# Patient Record
Sex: Male | Born: 1980
Health system: Southern US, Community
[De-identification: ages and names within clinical notes are randomized; demographics above are authoritative.]

## PROBLEM LIST (undated history)

## (undated) DIAGNOSIS — F1010 Alcohol abuse, uncomplicated: Secondary | ICD-10-CM

## (undated) DIAGNOSIS — I10 Essential (primary) hypertension: Secondary | ICD-10-CM

## (undated) DIAGNOSIS — F32A Depression, unspecified: Secondary | ICD-10-CM

## (undated) DIAGNOSIS — E785 Hyperlipidemia, unspecified: Secondary | ICD-10-CM

## (undated) DIAGNOSIS — R55 Syncope and collapse: Secondary | ICD-10-CM

## (undated) HISTORY — DX: Hyperlipidemia, unspecified: E78.5

## (undated) HISTORY — DX: Depression, unspecified: F32.A

## (undated) HISTORY — DX: Alcohol abuse, uncomplicated: F10.10

## (undated) HISTORY — DX: Essential (primary) hypertension: I10

## (undated) HISTORY — DX: Syncope and collapse: R55

## (undated) HISTORY — PX: TYMPANOPLASTY: SHX33

---

## 2006-07-14 ENCOUNTER — Emergency Department: Payer: Self-pay | Admitting: Emergency Medicine

## 2008-02-06 ENCOUNTER — Emergency Department: Payer: Self-pay | Admitting: Emergency Medicine

## 2008-04-12 ENCOUNTER — Emergency Department: Payer: Self-pay | Admitting: Emergency Medicine

## 2008-04-14 ENCOUNTER — Emergency Department: Payer: Self-pay | Admitting: Internal Medicine

## 2009-08-10 ENCOUNTER — Emergency Department: Payer: Self-pay | Admitting: Emergency Medicine

## 2010-02-27 ENCOUNTER — Ambulatory Visit: Payer: Self-pay | Admitting: Family Medicine

## 2011-03-14 ENCOUNTER — Ambulatory Visit: Payer: Self-pay | Admitting: Family Medicine

## 2011-04-11 ENCOUNTER — Ambulatory Visit: Payer: Self-pay | Admitting: Family Medicine

## 2011-07-05 ENCOUNTER — Encounter: Payer: Self-pay | Admitting: Orthopedic Surgery

## 2011-07-08 ENCOUNTER — Encounter: Payer: Self-pay | Admitting: Orthopedic Surgery

## 2015-07-27 ENCOUNTER — Encounter: Payer: Self-pay | Admitting: Emergency Medicine

## 2015-07-27 ENCOUNTER — Emergency Department
Admission: EM | Admit: 2015-07-27 | Discharge: 2015-07-28 | Disposition: A | Discharge status: Home / Self Care | Payer: BLUE CROSS/BLUE SHIELD | Attending: Emergency Medicine | Admitting: Emergency Medicine

## 2015-07-27 DIAGNOSIS — M5431 Sciatica, right side: Secondary | ICD-10-CM

## 2015-07-27 DIAGNOSIS — M5441 Lumbago with sciatica, right side: Secondary | ICD-10-CM | POA: Diagnosis not present

## 2015-07-27 DIAGNOSIS — M545 Low back pain: Secondary | ICD-10-CM | POA: Diagnosis present

## 2015-07-27 DIAGNOSIS — R531 Weakness: Secondary | ICD-10-CM | POA: Diagnosis not present

## 2015-07-27 DIAGNOSIS — R29898 Other symptoms and signs involving the musculoskeletal system: Secondary | ICD-10-CM

## 2015-07-27 DIAGNOSIS — R52 Pain, unspecified: Secondary | ICD-10-CM

## 2015-07-27 MED ORDER — SODIUM CHLORIDE 0.9 % IV BOLUS (SEPSIS)
1000.0000 mL | Freq: Once | INTRAVENOUS | Status: AC
  Administered 2015-07-27: 1000 mL via INTRAVENOUS

## 2015-07-27 MED ORDER — MORPHINE SULFATE (PF) 4 MG/ML IV SOLN
4.0000 mg | Freq: Once | INTRAVENOUS | Status: AC
  Administered 2015-07-27: 4 mg via INTRAVENOUS

## 2015-07-27 MED ORDER — ONDANSETRON HCL 4 MG/2ML IJ SOLN
4.0000 mg | Freq: Once | INTRAMUSCULAR | Status: AC
  Administered 2015-07-27: 4 mg via INTRAVENOUS

## 2015-07-27 MED ORDER — MORPHINE SULFATE (PF) 2 MG/ML IV SOLN
INTRAVENOUS | Status: AC
  Filled 2015-07-27: qty 1

## 2015-07-27 MED ORDER — ONDANSETRON HCL 4 MG/2ML IJ SOLN
INTRAMUSCULAR | Status: AC
  Administered 2015-07-27: 4 mg via INTRAVENOUS
  Filled 2015-07-27: qty 2

## 2015-07-27 NOTE — ED Notes (Addendum)
Pt c/o pain to right leg for several days with right lower back pain; denies hx of same; st was lifting something several weeks with onset; pt with sudden onset restlessness, diaphoresis and dizziness while in triage; placed in recliner and taken immed to room 15; placed on card monitor; SL initated and Dr Manson PasseyBrown called to room; pt reports hx back injury weight lifting in his 20's; last 2 days has missed work due to back pain and has done nothing by lie around due to increased pain with movement and weight bearing; st unsure if his reaction is due to his pain or not because he has not been ambulating at home

## 2015-07-27 NOTE — ED Notes (Signed)
Per triage nurse, pt had near syncopal episode and was hypotensive and diaphoretic during triage, pt brought to rm 15, EKG done, pain medication given

## 2015-07-27 NOTE — ED Provider Notes (Signed)
[facility] Emergency Department Provider Note  ____________________________________________  Time seen:  11:20 PM  I have reviewed the triage vital signs and the nursing notes.   HISTORY  Chief Complaint Back Pain     HPI Andrew Parsons is a 35 y.o. male with history of previous back injury presents with right lower back pain with radiation down posterior buttocks and leg 2 days. Patient denies any leg weakness no numbness or gait instability. Patient states pain is worse with any movement improved with laying completely supine on his back. Current pain score 10 out of 10. Of note patient was being evaluated and in triage patient stated that he went from a seated to standing position with acute onset of dizziness and diaphoresis.    Past medical history Back injury many years ago There are no active problems to display for this patient.  Past surgical history None No current outpatient prescriptions on file.  Allergies No known drug allergies  No family history on file.  Social History Social History  Substance Use Topics  . Smoking status: Never Smoker   . Smokeless tobacco: None  . Alcohol Use: No    Review of Systems  Constitutional: Negative for fever. Eyes: Negative for visual changes. ENT: Negative for sore throat. Cardiovascular: Negative for chest pain. Respiratory: Negative for shortness of breath. Gastrointestinal: Negative for abdominal pain, vomiting and diarrhea. Genitourinary: Negative for dysuria. Musculoskeletal: Positive for back pain. Skin: Negative for rash. Neurological: Negative for headaches, focal weakness or numbness.   10-point ROS otherwise negative.  ____________________________________________   PHYSICAL EXAM:  VITAL SIGNS: ED Triage Vitals  Enc Vitals Group     BP 07/27/15 2303 132/64 mmHg     Pulse Rate 07/27/15 2303 75     Resp 07/27/15 2303 20     Temp 07/27/15 2303 97.8 F (36.6 C)     Temp  Source 07/27/15 2303 Oral     SpO2 07/27/15 2303 100 %     Weight 07/27/15 2303 195 lb (88.451 kg)     Height 07/27/15 2303 5\' 10"  (1.778 m)     Head Cir --      Peak Flow --      Pain Score 07/27/15 2310 10     Pain Loc --      Pain Edu? --      Excl. in GC? --      Constitutional: Alert and oriented. Well appearing and in no distress. Eyes: Conjunctivae are normal. PERRL. Normal extraocular movements. ENT   Head: Normocephalic and atraumatic.   Nose: No congestion/rhinnorhea.   Mouth/Throat: Mucous membranes are moist.   Neck: No stridor. Hematological/Lymphatic/Immunilogical: No cervical lymphadenopathy. Cardiovascular: Normal rate, regular rhythm. Normal and symmetric distal pulses are present in all extremities. No murmurs, rubs, or gallops. Respiratory: Normal respiratory effort without tachypnea nor retractions. Breath sounds are clear and equal bilaterally. No wheezes/rales/rhonchi. Gastrointestinal: Soft and nontender. No distention. There is no CVA tenderness. Genitourinary: deferred Musculoskeletal: Nontender with normal range of motion in all extremities. No joint effusions.  No lower extremity tenderness nor edema. Neurologic:  Normal speech and language. No gross focal neurologic deficits are appreciated. Speech is normal.  Skin:  Skin is warm, dry and intact. No rash noted. Psychiatric: Mood and affect are normal. Speech and behavior are normal. Patient exhibits appropriate insight and judgment.  ____________________________________________    LABS (pertinent positives/negatives)  Labs Reviewed  BASIC METABOLIC PANEL - Abnormal; Notable for the following:    Potassium  3.3 (*)    Creatinine, Ser 1.30 (*)    All other components within normal limits  CBC  TROPONIN I     ____________________________________________   EKG  ED ECG REPORT I, [place] Perkey, the attending physician, personally viewed and interpreted this ECG.   Date:  07/28/2015  EKG Time: 11:22 PM  Rate: 75  Rhythm: Normal sinus rhythm  Axis: Normal  Intervals: Normal  ST&T Change: None   ____________________________________________    RADIOLOGY  MR Lumbar Spine Wo Contrast (Final result) Result time: 07/28/15 01:40:09   Final result by Rad Results In Interface (07/28/15 01:40:09)   Narrative:   CLINICAL DATA: RIGHT leg pain for several days after lifting injury a few weeks prior. Evaluate RIGHT leg weakness.  EXAM: MRI LUMBAR SPINE WITHOUT CONTRAST  TECHNIQUE: Multiplanar, multisequence MR imaging of the lumbar spine was performed. No intravenous contrast was administered.  COMPARISON: None.  FINDINGS: OSSEOUS STRUCTURES: Lumbar vertebral bodies are intact and aligned with maintenance of lumbar lordosis. Using the reference level of the last well-formed intervertebral disc as L5-S1, moderate L5-S1 disc height loss with decreased T2 signal within this disc compatible with desiccation. Mild subacute discogenic endplate change at L5-S1. No STIR signal abnormality to suggest acute osseous process. Borderline congenital canal narrowing on the basis of foreshortened pedicles.  SPINAL CORD: Conus medullaris terminates at L1-2 and demonstrates normal morphology and signal characteristics. Cauda equina is normal.  SOFT TISSUES: Included prevertebral and paraspinal soft tissues are normal.  LEVEL BY LEVEL EVALUATION:  T12-L1 through L2-3: No disc bulge, canal stenosis nor neural foraminal narrowing.  L3-4: Annular bulging. Mild facet arthropathy without canal stenosis horn neural foraminal narrowing.  L4-5: Small broad-based disc bulge. Mild facet arthropathy and ligamentum flavum redundancy without canal stenosis. Minimal neural foraminal narrowing.  L5-S1: 8 mm AP RIGHT central disc protrusion and annular fissure contacts and deforms the traversing S1 nerves in the lateral recesses. Mild canal stenosis. Mild facet  arthropathy and ligamentum flavum redundancy. Minimal neural foraminal narrowing.  IMPRESSION: Moderate to large L5-S1 central disc protrusion and annular fissure contacts the traversing S1 nerves and results in mild canal stenosis.  Minimal L4-5 and L5-S1 neural foraminal narrowing.  No acute fracture or malalignment.   Electronically Signed By: Awilda Metro M.D. On: 07/28/2015 01:40     INITIAL IMPRESSION / ASSESSMENT AND PLAN / ED COURSE  Pertinent labs & imaging results that were available during my care of the patient were reviewed by me and considered in my medical decision making (see chart for details).  History of physical exam consistent with sciatica for presenting complaint. Regarding episode in the waiting room suspect orthostatic hypotension as patient's symptoms completely resolved at the time of evaluation and ED treatment room. No further events similar to what occurred in the waiting room.  ____________________________________________   FINAL CLINICAL IMPRESSION(S) / ED DIAGNOSES  Final diagnoses:  Pain  Right leg weakness  Sciatica of right side      Darci Current, MD 07/28/15 0320

## 2015-07-28 ENCOUNTER — Emergency Department: Payer: BLUE CROSS/BLUE SHIELD

## 2015-07-28 LAB — CBC
HEMATOCRIT: 46.7 % (ref 40.0–52.0)
HEMOGLOBIN: 16.5 g/dL (ref 13.0–18.0)
MCH: 31.8 pg (ref 26.0–34.0)
MCHC: 35.4 g/dL (ref 32.0–36.0)
MCV: 89.9 fL (ref 80.0–100.0)
Platelets: 244 10*3/uL (ref 150–440)
RBC: 5.19 MIL/uL (ref 4.40–5.90)
RDW: 13.6 % (ref 11.5–14.5)
WBC: 10 10*3/uL (ref 3.8–10.6)

## 2015-07-28 LAB — BASIC METABOLIC PANEL
ANION GAP: 7 (ref 5–15)
BUN: 16 mg/dL (ref 6–20)
CHLORIDE: 104 mmol/L (ref 101–111)
CO2: 28 mmol/L (ref 22–32)
Calcium: 9.1 mg/dL (ref 8.9–10.3)
Creatinine, Ser: 1.3 mg/dL — ABNORMAL HIGH (ref 0.61–1.24)
GFR calc Af Amer: >60 mL/min (ref >60)
GLUCOSE: 82 mg/dL (ref 65–99)
POTASSIUM: 3.3 mmol/L — AB (ref 3.5–5.1)
SODIUM: 139 mmol/L (ref 135–145)

## 2015-07-28 LAB — TROPONIN I: Troponin I: <0.03 ng/mL (ref <0.031)

## 2015-07-28 MED ORDER — OXYCODONE-ACETAMINOPHEN 5-325 MG PO TABS
1.0000 | ORAL_TABLET | Freq: Once | ORAL | Status: AC
  Administered 2015-07-28: 1 via ORAL
  Filled 2015-07-28: qty 1

## 2015-07-28 MED ORDER — OXYCODONE-ACETAMINOPHEN 5-325 MG PO TABS: 1.0000 | ORAL_TABLET | ORAL | Status: DC | PRN

## 2015-07-28 NOTE — ED Notes (Signed)
Pt returned from MRI °

## 2015-07-28 NOTE — Discharge Instructions (Signed)

## 2015-07-28 NOTE — ED Notes (Signed)
Pt taken to MRI  

## 2015-09-20 ENCOUNTER — Emergency Department
Admission: EM | Admit: 2015-09-20 | Discharge: 2015-09-20 | Disposition: A | Discharge status: Home / Self Care | Payer: BLUE CROSS/BLUE SHIELD | Attending: Student in an Organized Health Care Education/Training Program | Admitting: Student in an Organized Health Care Education/Training Program

## 2015-09-20 ENCOUNTER — Encounter: Payer: Self-pay | Admitting: Emergency Medicine

## 2015-09-20 DIAGNOSIS — Y929 Unspecified place or not applicable: Secondary | ICD-10-CM | POA: Diagnosis not present

## 2015-09-20 DIAGNOSIS — X500XXA Overexertion from strenuous movement or load, initial encounter: Secondary | ICD-10-CM | POA: Insufficient documentation

## 2015-09-20 DIAGNOSIS — S3992XA Unspecified injury of lower back, initial encounter: Secondary | ICD-10-CM | POA: Diagnosis present

## 2015-09-20 DIAGNOSIS — Y999 Unspecified external cause status: Secondary | ICD-10-CM | POA: Diagnosis not present

## 2015-09-20 DIAGNOSIS — Y9389 Activity, other specified: Secondary | ICD-10-CM | POA: Diagnosis not present

## 2015-09-20 DIAGNOSIS — S39012A Strain of muscle, fascia and tendon of lower back, initial encounter: Secondary | ICD-10-CM | POA: Diagnosis not present

## 2015-09-20 MED ORDER — NAPROXEN 500 MG PO TABS: 500.0000 mg | ORAL_TABLET | Freq: Two times a day (BID) | ORAL | 0 refills | Status: DC

## 2015-09-20 MED ORDER — CYCLOBENZAPRINE HCL 10 MG PO TABS: 10.0000 mg | ORAL_TABLET | Freq: Three times a day (TID) | ORAL | 0 refills | Status: DC | PRN

## 2015-09-20 NOTE — ED Provider Notes (Signed)
[facility] Emergency Department Provider Note ____________________________________________  Time seen: Approximately 7:37 PM  I have reviewed the triage vital signs and the nursing notes.   HISTORY  Chief Complaint Back Pain    HPI Andrew Parsons is a 35 y.o. male who presents to the emergency department for evaluation of lower back pain. He states that he was lifting some heavy weights several hours ago and feels like he pulled something.He has not taken anything for pain prior to arrival.  History reviewed. No pertinent past medical history.  There are no active problems to display for this patient.   History reviewed. No pertinent surgical history.  Prior to Admission medications   Medication Sig Start Date End Date Taking? Authorizing Provider  cyclobenzaprine (FLEXERIL) 10 MG tablet Take 1 tablet (10 mg total) by mouth 3 (three) times daily as needed for muscle spasms. 09/20/15   Chinita Pesterari B Saide Lanuza, FNP  naproxen (NAPROSYN) 500 MG tablet Take 1 tablet (500 mg total) by mouth 2 (two) times daily with a meal. 09/20/15   Chinita Pesterari B Dmarion Perfect, FNP    Allergies Review of patient's allergies indicates no known allergies.  No family history on file.  Social History Social History  Substance Use Topics  . Smoking status: Never Smoker  . Smokeless tobacco: Never Used  . Alcohol use No    Review of Systems Constitutional: No recent illness. Cardiovascular: Denies chest pain or palpitations. Respiratory: Denies shortness of breath. Musculoskeletal: Pain in Lower back Skin: Negative for rash, wound, lesion. Neurological: Negative for focal weakness or numbness.  ____________________________________________   PHYSICAL EXAM:  VITAL SIGNS: ED Triage Vitals  Enc Vitals Group     BP 09/20/15 1851 (!) 147/90     Pulse Rate 09/20/15 1851 96     Resp 09/20/15 1851 16     Temp 09/20/15 1851 98.9 F (37.2 C)     Temp Source 09/20/15 1851 Oral     SpO2  09/20/15 1851 98 %     Weight 09/20/15 1847 210 lb (95.3 kg)     Height 09/20/15 1847 5\' 11"  (1.803 m)     Head Circumference --      Peak Flow --      Pain Score 09/20/15 1847 8     Pain Loc --      Pain Edu? --      Excl. in GC? --     Constitutional: Alert and oriented. Well appearing and in no acute distress. Eyes: Conjunctivae are normal. EOMI. Head: Atraumatic. Neck: No stridor.  Respiratory: Normal respiratory effort.   Musculoskeletal: Limited flexion of the lower back due to pain. Patient noted to be ambulatory without assistance. No midline focal tenderness over the lumbar spine. No step-off or deformity noted. Neurologic:  Normal speech and language. No gross focal neurologic deficits are appreciated. Speech is normal. No gait instability. Skin:  Skin is warm, dry and intact. Atraumatic. Psychiatric: Mood and affect are normal. Speech and behavior are normal.  ____________________________________________   LABS (all labs ordered are listed, but only abnormal results are displayed)  Labs Reviewed - No data to display ____________________________________________  RADIOLOGY  Not indicated ____________________________________________   PROCEDURES  Procedure(s) performed: None   ____________________________________________   INITIAL IMPRESSION / ASSESSMENT AND PLAN / ED COURSE  Pertinent labs & imaging results that were available during my care of the patient were reviewed by me and considered in my medical decision making (see chart for details).  Patient was given prescriptions  for Flexeril and Naprosyn. He was instructed to follow-up with orthopedics for symptoms that are not improving over the week. He was instructed to return to the emergency department for symptoms that change or worsen if he is unable to schedule an appointment.  ____________________________________________   FINAL CLINICAL IMPRESSION(S) / ED DIAGNOSES  Final diagnoses:  Lumbar  strain, initial encounter       Chinita PesterCari B Sadik Piascik, FNP 09/20/15 2347    Willy EddyPatrick Robinson, MD 09/20/15 2351

## 2015-09-20 NOTE — ED Notes (Signed)
Patient presents to the ED with lower back since he was lifting heavy weights this am and felt, "like I pulled something".  Patient ambulatory to room 43 and is in no obvious distress at this time.  Patient states history of back injury before and having to come to ER.

## 2015-09-20 NOTE — ED Triage Notes (Signed)
Patient presents to the ED with lower back since he was lifting heavy weights several hours ago and felt, "like I pulled something".  Patient ambulatory to triage and is in no obvious distress at this time.  Patient reports history of back injury.

## 2015-09-20 NOTE — ED Notes (Signed)
Discharge instructions reviewed with patient. Questions fielded by this RN. Patient verbalizes understanding of instructions. Patient discharged home in stable condition per Triplett NP. No acute distress noted at time of discharge.   

## 2015-12-16 ENCOUNTER — Encounter: Payer: Self-pay | Admitting: Emergency Medicine

## 2015-12-16 ENCOUNTER — Emergency Department
Admission: EM | Admit: 2015-12-16 | Discharge: 2015-12-16 | Disposition: A | Discharge status: Home / Self Care | Payer: BLUE CROSS/BLUE SHIELD | Attending: Student in an Organized Health Care Education/Training Program | Admitting: Student in an Organized Health Care Education/Training Program

## 2015-12-16 DIAGNOSIS — H9201 Otalgia, right ear: Secondary | ICD-10-CM | POA: Diagnosis present

## 2015-12-16 DIAGNOSIS — H6121 Impacted cerumen, right ear: Secondary | ICD-10-CM | POA: Insufficient documentation

## 2015-12-16 DIAGNOSIS — H7291 Unspecified perforation of tympanic membrane, right ear: Secondary | ICD-10-CM | POA: Diagnosis not present

## 2015-12-16 DIAGNOSIS — Z791 Long term (current) use of non-steroidal anti-inflammatories (NSAID): Secondary | ICD-10-CM | POA: Diagnosis not present

## 2015-12-16 DIAGNOSIS — F17210 Nicotine dependence, cigarettes, uncomplicated: Secondary | ICD-10-CM | POA: Diagnosis not present

## 2015-12-16 MED ORDER — CIPROFLOXACIN-HYDROCORTISONE 0.2-1 % OT SUSP: 3.0000 [drp] | Freq: Two times a day (BID) | OTIC | 0 refills | Status: AC

## 2015-12-16 MED ORDER — AMOXICILLIN 875 MG PO TABS: 875.0000 mg | ORAL_TABLET | Freq: Two times a day (BID) | ORAL | 0 refills | Status: AC

## 2015-12-16 MED ORDER — CARBAMIDE PEROXIDE 6.5 % OT SOLN
3.0000 [drp] | OTIC | Status: AC
  Administered 2015-12-16: 3 [drp] via OTIC
  Filled 2015-12-16: qty 15

## 2015-12-16 NOTE — ED Notes (Signed)
Pt c/o right ear pain that began last night. Pt c/o sore throat X 3 days.

## 2015-12-16 NOTE — ED Triage Notes (Signed)
Patient ambulatory to triage with steady gait, without difficulty or distress noted; pt reports right earache, congestion since yesterday

## 2015-12-16 NOTE — ED Notes (Signed)
Ear wax irrigation completed in right ear.  Noted that there are abrasions in the right ear once it had been irrigated.

## 2015-12-16 NOTE — ED Provider Notes (Signed)
[facility] Emergency Department Provider Note  ____________________________________________  Time seen: Approximately 8:45 PM  I have reviewed the triage vital signs and the nursing notes.   HISTORY  Chief Complaint Otalgia   HPI Andrew Parsons is a 35 y.o. male presents with right ear pain for 2 days. Patient states that the pain is throbbing and constant. Patient states that he could feel his ears popping. Patient denies drainage. Patient has recently been sick with a sore throat. Patient denies additional URI symptoms including coughing, sneezing, sinus tenderness, fever, or chills. Patient has not taken anything for pain. Patient frequently uses earplugs at work. Patient denies using Q-tips. Patient has had frequent ear infections in the past.   History reviewed. No pertinent past medical history.  There are no active problems to display for this patient.   Past Surgical History:  Procedure Laterality Date  . TYMPANOPLASTY      Prior to Admission medications   Medication Sig Start Date End Date Taking? Authorizing Provider  amoxicillin (AMOXIL) 875 MG tablet Take 1 tablet (875 mg total) by mouth 2 (two) times daily. 12/16/15 12/26/15  Enid DerryAshley Bartt Gonzaga, PA-C  ciprofloxacin-hydrocortisone (CIPRO HC) otic suspension Place 3 drops into the right ear 2 (two) times daily. 12/16/15 12/23/15  Enid DerryAshley Savaya Hakes, PA-C  cyclobenzaprine (FLEXERIL) 10 MG tablet Take 1 tablet (10 mg total) by mouth 3 (three) times daily as needed for muscle spasms. 09/20/15   Chinita Pesterari B Triplett, FNP  naproxen (NAPROSYN) 500 MG tablet Take 1 tablet (500 mg total) by mouth 2 (two) times daily with a meal. 09/20/15   Chinita Pesterari B Triplett, FNP    Allergies Patient has no known allergies.  No family history on file.  Social History Social History  Substance Use Topics  . Smoking status: Current Every Day Smoker    Packs/day: 0.50    Types: Cigarettes  . Smokeless tobacco: Never Used  . Alcohol use  No    Review of Systems Constitutional: No fever/chills ENT: No sinus tenderness.  Cardiovascular: Denies chest pain. Respiratory: Negative shortness of breath. Negative for cough. Gastrointestinal: No nausea,  No vomiting.   Skin: Negative for rash.  ____________________________________________   PHYSICAL EXAM:  VITAL SIGNS: ED Triage Vitals  Enc Vitals Group     BP 12/16/15 2018 (!) 152/79     Pulse Rate 12/16/15 2018 73     Resp 12/16/15 2018 18     Temp 12/16/15 2018 98 F (36.7 C)     Temp Source 12/16/15 2018 Oral     SpO2 12/16/15 2018 100 %     Weight 12/16/15 2016 210 lb (95.3 kg)     Height 12/16/15 2016 5\' 11"  (1.803 m)     Head Circumference --      Peak Flow --      Pain Score 12/16/15 2017 9     Pain Loc --      Pain Edu? --      Excl. in GC? --     Constitutional: Alert and oriented. Patient well appearing and in no acute distress. Eyes: Conjunctivae are normal. EOMI. Ears: Tympanic membrane in right ear blocked by cerumen on initial examination. Ear canal red and irritated. After cerumen removal, tympanic membrane not visualized, no cone of light. Blood in canal. Left tympanic membrane pearly grey with cone of light and good landmarks.  Nose: No congestion; No rhinnorhea. Mouth/Throat: Mucous membranes are moist.  Oropharynx non erythematous. Tonsils appear non enlarged. Neck: No stridor.  Cardiovascular: Normal rate, regular rhythm. Grossly normal heart sounds.  Good peripheral circulation. Respiratory: Normal respiratory effort.  No retractions.  Gastrointestinal: Soft and nontender.  Musculoskeletal: FROM x 4 extremities.  Neurologic:  Normal speech and language.  Skin:  Skin is warm, dry and intact. No rash noted. Psychiatric: Mood and affect are normal. Speech and behavior are normal.  ____________________________________________   LABS (all labs ordered are listed, but only abnormal results are displayed)  Labs Reviewed - No data to  display ____________________________________________  ____________________________________________   PROCEDURES  Procedure(s) performed: Cerumen disimpaction.   Critical Care performed: No  ____________________________________________   INITIAL IMPRESSION / ASSESSMENT AND PLAN / ED COURSE  Clinical Course     Pertinent labs & imaging results that were available during my care of the patient were reviewed by me and considered in my medical decision making (see chart for details).  My assessment is that this patient has a perforated eardrum. This is likely because patient has had recent ear infections in the past, frequently uses earplugs at work, and had wax blocking ear canal. Ear canal was irritated and cone of light was not visualized.  ____________________________________________   FINAL CLINICAL IMPRESSION(S) / ED DIAGNOSES  Final diagnoses:  Perforated eardrum, right    Note:  This document was prepared using Dragon voice recognition software and may include unintentional dictation errors.    Enid DerryAshley Mardene Lessig, PA-C 12/16/15 2324    Willy EddyPatrick Robinson, MD 12/16/15 [phone]

## 2017-01-23 ENCOUNTER — Emergency Department
Admission: EM | Admit: 2017-01-23 | Discharge: 2017-01-23 | Disposition: A | Discharge status: Home / Self Care | Payer: BLUE CROSS/BLUE SHIELD | Attending: Emergency Medicine | Admitting: Emergency Medicine

## 2017-01-23 ENCOUNTER — Encounter: Payer: Self-pay | Admitting: Emergency Medicine

## 2017-01-23 DIAGNOSIS — L0291 Cutaneous abscess, unspecified: Secondary | ICD-10-CM

## 2017-01-23 DIAGNOSIS — L02411 Cutaneous abscess of right axilla: Secondary | ICD-10-CM | POA: Diagnosis present

## 2017-01-23 MED ORDER — SULFAMETHOXAZOLE-TRIMETHOPRIM 800-160 MG PO TABS: 1.0000 | ORAL_TABLET | Freq: Two times a day (BID) | ORAL | 0 refills | Status: AC

## 2017-01-23 NOTE — ED Triage Notes (Signed)
Pt to ED via POV with c/o "knot in armpit" x couple wks. Pt states it must have popped last night in his sleep and is now sore. Drainage noted to area but decrease swelling since last night. Pt in NAD at this time

## 2017-01-23 NOTE — ED Provider Notes (Signed)
[facility] Emergency Department Provider Note  ____________________________________________  Time seen: Approximately 4:25 PM  I have reviewed the triage vital signs and the nursing notes.   HISTORY  Chief Complaint Abscess    HPI Andrew Parsons is a 36 y.o. male presents to the emergency department with a remnant of a right axillary abscess.  Patient reports that it spontaneously started draining in his sleep.  Patient has never had an abscess in the past and conveys that he was "scared".  He denies fever or chills.  No alleviating measures have been attempted.   History reviewed. No pertinent past medical history.  There are no active problems to display for this patient.   Past Surgical History:  Procedure Laterality Date  . TYMPANOPLASTY      Prior to Admission medications   Medication Sig Start Date End Date Taking? Authorizing Provider  cyclobenzaprine (FLEXERIL) 10 MG tablet Take 1 tablet (10 mg total) by mouth 3 (three) times daily as needed for muscle spasms. 09/20/15   Triplett, Rulon Eisenmengerari B, FNP  naproxen (NAPROSYN) 500 MG tablet Take 1 tablet (500 mg total) by mouth 2 (two) times daily with a meal. 09/20/15   Triplett, Cari B, FNP  sulfamethoxazole-trimethoprim (BACTRIM DS,SEPTRA DS) 800-160 MG tablet Take 1 tablet by mouth 2 (two) times daily for 7 days. 01/23/17 01/30/17  Orvil FeilWoods, Cailyn Houdek M, PA-C    Allergies Patient has no known allergies.  No family history on file.  Social History Social History   Tobacco Use  . Smoking status: Current Every Day Smoker    Packs/day: 0.50    Types: Cigarettes  . Smokeless tobacco: Never Used  Substance Use Topics  . Alcohol use: No  . Drug use: No     Review of Systems  Constitutional: No fever/chills Eyes: No visual changes. No discharge ENT: No upper respiratory complaints. Cardiovascular: no chest pain. Respiratory: no cough. No SOB. Musculoskeletal: Negative for musculoskeletal pain. Skin:  Patient has right axillary abscess.  Neurological: Negative for headaches, focal weakness or numbness.  ____________________________________________   PHYSICAL EXAM:  VITAL SIGNS: ED Triage Vitals  Enc Vitals Group     BP 01/23/17 1508 137/77     Pulse Rate 01/23/17 1508 67     Resp 01/23/17 1508 18     Temp 01/23/17 1508 (!) 97.4 F (36.3 C)     Temp Source 01/23/17 1508 Oral     SpO2 01/23/17 1508 97 %     Weight 01/23/17 1508 215 lb (97.5 kg)     Height 01/23/17 1508 5\' 11"  (1.803 m)     Head Circumference --      Peak Flow --      Pain Score 01/23/17 1522 6     Pain Loc --      Pain Edu? --      Excl. in GC? --      Constitutional: Alert and oriented. Well appearing and in no acute distress. Eyes: Conjunctivae are normal. PERRL. EOMI. Head: Atraumatic. Cardiovascular: Normal rate, regular rhythm. Normal S1 and S2.  Good peripheral circulation. Respiratory: Normal respiratory effort without tachypnea or retractions. Lungs CTAB. Good air entry to the bases with no decreased or absent breath sounds. Musculoskeletal: Full range of motion to all extremities. No gross deformities appreciated. Neurologic:  Normal speech and language. No gross focal neurologic deficits are appreciated.  Skin: Patient has 2 cm x 2 cm spontaneously draining right axillary abscess that has been apparent for the past 3 days.  No induration or surrounding cellulitis. Psychiatric: Mood and affect are normal. Speech and behavior are normal. Patient exhibits appropriate insight and judgement.   ____________________________________________   LABS (all labs ordered are listed, but only abnormal results are displayed)  Labs Reviewed - No data to display ____________________________________________  EKG   ____________________________________________  RADIOLOGY   No results found.  ____________________________________________    PROCEDURES  Procedure(s) performed:     Procedures    Medications - No data to display   ____________________________________________   INITIAL IMPRESSION / ASSESSMENT AND PLAN / ED COURSE  Pertinent labs & imaging results that were available during my care of the patient were reviewed by me and considered in my medical decision making (see chart for details).  Review of the [place] CSRS was performed in accordance of the [facility] prior to dispensing any controlled drugs.     Assessment and plan Right axillary abscess Patient presents to the emergency department with a spontaneously draining 2 cm x 2 cm right axillary abscess.  Incision and drainage is not warranted at this time.  Patient was discharged with Bactrim. Vital signs are reassuring prior to discharge. All patient questions were answered.     ____________________________________________  FINAL CLINICAL IMPRESSION(S) / ED DIAGNOSES  Final diagnoses:  Abscess      NEW MEDICATIONS STARTED DURING THIS VISIT:  ED Discharge Orders        Ordered    sulfamethoxazole-trimethoprim (BACTRIM DS,SEPTRA DS) 800-160 MG tablet  2 times daily     01/23/17 1623          This chart was dictated using voice recognition software/Dragon. Despite best efforts to proofread, errors can occur which can change the meaning. Any change was purely unintentional.    Orvil FeilWoods, Becki Mccaskill M, PA-C 01/23/17 1631    Minna AntisPaduchowski, Kevin, MD 01/23/17 2329

## 2019-08-26 ENCOUNTER — Ambulatory Visit (INDEPENDENT_AMBULATORY_CARE_PROVIDER_SITE_OTHER): Payer: BC Managed Care – PPO | Admitting: Nurse Practitioner

## 2019-08-26 ENCOUNTER — Other Ambulatory Visit: Payer: Self-pay

## 2019-08-26 ENCOUNTER — Encounter: Payer: Self-pay | Admitting: Nurse Practitioner

## 2019-08-26 DIAGNOSIS — R5383 Other fatigue: Secondary | ICD-10-CM | POA: Insufficient documentation

## 2019-08-26 DIAGNOSIS — F419 Anxiety disorder, unspecified: Secondary | ICD-10-CM | POA: Insufficient documentation

## 2019-08-26 DIAGNOSIS — G479 Sleep disorder, unspecified: Secondary | ICD-10-CM | POA: Diagnosis not present

## 2019-08-26 DIAGNOSIS — F1920 Other psychoactive substance dependence, uncomplicated: Secondary | ICD-10-CM

## 2019-08-26 DIAGNOSIS — R55 Syncope and collapse: Secondary | ICD-10-CM | POA: Diagnosis not present

## 2019-08-26 HISTORY — DX: Syncope and collapse: R55

## 2019-08-26 HISTORY — DX: Other psychoactive substance dependence, uncomplicated: F19.20

## 2019-08-26 HISTORY — DX: Sleep disorder, unspecified: G47.9

## 2019-08-26 HISTORY — DX: Anxiety disorder, unspecified: F41.9

## 2019-08-26 HISTORY — DX: Other fatigue: R53.83

## 2019-08-26 LAB — COMPREHENSIVE METABOLIC PANEL
ALT: 36 U/L (ref 0–53)
AST: 23 U/L (ref 0–37)
Albumin: 4.4 g/dL (ref 3.5–5.2)
Alkaline Phosphatase: 68 U/L (ref 39–117)
BUN: 15 mg/dL (ref 6–23)
CO2: 29 mEq/L (ref 19–32)
Calcium: 9.3 mg/dL (ref 8.4–10.5)
Chloride: 103 mEq/L (ref 96–112)
Creatinine, Ser: 1.12 mg/dL (ref 0.40–1.50)
GFR: 87.42 mL/min (ref >60.00)
Glucose, Bld: 106 mg/dL — ABNORMAL HIGH (ref 70–99)
Potassium: 3.8 mEq/L (ref 3.5–5.1)
Sodium: 138 mEq/L (ref 135–145)
Total Bilirubin: 0.4 mg/dL (ref 0.2–1.2)
Total Protein: 7.3 g/dL (ref 6.0–8.3)

## 2019-08-26 LAB — CBC WITH DIFFERENTIAL/PLATELET
Basophils Absolute: 0 10*3/uL (ref 0.0–0.1)
Basophils Relative: 0.3 % (ref 0.0–3.0)
Eosinophils Absolute: 0.2 10*3/uL (ref 0.0–0.7)
Eosinophils Relative: 2.5 % (ref 0.0–5.0)
HCT: 41.4 % (ref 39.0–52.0)
Hemoglobin: 14.5 g/dL (ref 13.0–17.0)
Lymphocytes Relative: 27.8 % (ref 12.0–46.0)
Lymphs Abs: 1.9 10*3/uL (ref 0.7–4.0)
MCHC: 35.1 g/dL (ref 30.0–36.0)
MCV: 89 fl (ref 78.0–100.0)
Monocytes Absolute: 0.6 10*3/uL (ref 0.1–1.0)
Monocytes Relative: 8.6 % (ref 3.0–12.0)
Neutro Abs: 4.1 10*3/uL (ref 1.4–7.7)
Neutrophils Relative %: 60.8 % (ref 43.0–77.0)
Platelets: 205 10*3/uL (ref 150.0–400.0)
RBC: 4.65 Mil/uL (ref 4.22–5.81)
RDW: 13.4 % (ref 11.5–15.5)
WBC: 6.8 10*3/uL (ref 4.0–10.5)

## 2019-08-26 LAB — LIPID PANEL
Cholesterol: 191 mg/dL (ref 0–200)
HDL: 48.4 mg/dL (ref >39.00)
LDL Cholesterol: 114 mg/dL — ABNORMAL HIGH (ref 0–99)
NonHDL: 143.04
Total CHOL/HDL Ratio: 4
Triglycerides: 146 mg/dL (ref 0.0–149.0)
VLDL: 29.2 mg/dL (ref 0.0–40.0)

## 2019-08-26 LAB — VITAMIN D 25 HYDROXY (VIT D DEFICIENCY, FRACTURES): VITD: 22.39 ng/mL — ABNORMAL LOW (ref 30.00–100.00)

## 2019-08-26 LAB — HEMOGLOBIN A1C: Hgb A1c MFr Bld: 5.8 % (ref 4.6–6.5)

## 2019-08-26 LAB — TSH: TSH: 2.31 u[IU]/mL (ref 0.35–4.50)

## 2019-08-26 NOTE — Patient Instructions (Addendum)
Please go to the lab today.  I placed referral sent to neurology to evaluate your passing out spells, falling asleep while standing up, concern over poor memory, and sleep apnea studies.  I placed referral to cardiology regarding your passing out spells, racing heart, and sweating spells.  I recommend that you talk to [phone] for  substance abuse assistance.   You can also contact [facility]  Please make an office visit with me in 1 week to go over results make further recommendations.  Syncope  Syncope refers to a condition in which a person temporarily loses consciousness. Syncope may also be called fainting or passing out. It is caused by a sudden decrease in blood flow to the brain. Even though most causes of syncope are not dangerous, syncope can be a sign of a serious medical problem. Your health care provider may do tests to find the reason why you are having syncope. Signs that you may be about to faint include:  Feeling dizzy or light-headed.  Feeling nauseous.  Seeing all white or all black in your field of vision.  Having cold, clammy skin. If you faint, get medical help right away. Call your local emergency services (911 in the [place] Do not drive yourself to the hospital. Follow these instructions at home: Pay attention to any changes in your symptoms. Take these actions to stay safe and to help relieve your symptoms: Lifestyle  Do not drive, use machinery, or play sports until your health care provider says it is okay.  Do not drink alcohol.  Do not use any products that contain nicotine or tobacco, such as cigarettes and e-cigarettes. If you need help quitting, ask your health care provider.  Drink enough fluid to keep your urine pale yellow. General instructions  Take over-the-counter and prescription medicines only as told by your health care provider.  If you are taking blood pressure or heart medicine, get up slowly and take several  minutes to sit and then stand. This can reduce dizziness or light-headedness.  Have someone stay with you until you feel stable.  If you start to feel like you might faint, lie down right away and raise (elevate) your feet above the level of your heart. Breathe deeply and steadily. Wait until all the symptoms have passed.  Keep all follow-up visits as told by your health care provider. This is important. Get help right away if you:  Have a severe headache.  Faint once or repeatedly.  Have pain in your chest, abdomen, or back.  Have a very fast or irregular heartbeat (palpitations).  Have pain when you breathe.  Are bleeding from your mouth or rectum, or you have black or tarry stool.  Have a seizure.  Are confused.  Have trouble walking.  Have severe weakness.  Have vision problems. These symptoms may represent a serious problem that is an emergency. Do not wait to see if your symptoms will go away. Get medical help right away. Call your local emergency services (911 in the [place] Do not drive yourself to the hospital. Summary  Syncope refers to a condition in which a person temporarily loses consciousness. It is caused by a sudden decrease in blood flow to the brain.  Signs that you may be about to faint include dizziness, feeling light-headed, feeling nauseous, sudden vision changes, or cold, clammy skin.  Although most causes of syncope are not dangerous, syncope can be a sign of a serious medical problem. If you faint,  get medical help right away. This information is not intended to replace advice given to you by your health care provider. Make sure you discuss any questions you have with your health care provider. Document Revised: 01/05/2017 Document Reviewed: 01/01/2017 Elsevier Patient Education  [place].  Fatigue If you have fatigue, you feel tired all the time and have a lack of energy or a lack of motivation. Fatigue may make it difficult to start or  complete tasks because of exhaustion. In general, occasional or mild fatigue is often a normal response to activity or life. However, long-lasting (chronic) or extreme fatigue may be a symptom of a medical condition. Follow these instructions at home: General instructions  Watch your fatigue for any changes.  Go to bed and get up at the same time every day.  Avoid fatigue by pacing yourself during the day and getting enough sleep at night.  Maintain a healthy weight. Medicines  Take over-the-counter and prescription medicines only as told by your health care provider.  Take a multivitamin, if told by your health care provider.  Do not use herbal or dietary supplements unless they are approved by your health care provider. Activity   Exercise regularly, as told by your health care provider.  Use or practice techniques to help you relax, such as yoga, tai chi, meditation, or massage therapy. Eating and drinking   Avoid heavy meals in the evening.  Eat a well-balanced diet, which includes lean proteins, whole grains, plenty of fruits and vegetables, and low-fat dairy products.  Avoid consuming too much caffeine.  Avoid the use of alcohol.  Drink enough fluid to keep your urine pale yellow. Lifestyle  Change situations that cause you stress. Try to keep your work and personal schedule in balance.  Do not use any products that contain nicotine or tobacco, such as cigarettes and e-cigarettes. If you need help quitting, ask your health care provider.  Do not use drugs. Contact a health care provider if:  Your fatigue does not get better.  You have a fever.  You suddenly lose or gain weight.  You have headaches.  You have trouble falling asleep or sleeping through the night.  You feel angry, guilty, anxious, or sad.  You are unable to have a bowel movement (constipation).  Your skin is dry.  You have swelling in your legs or another part of your body. Get help  right away if:  You feel confused.  Your vision is blurry.  You feel faint or you pass out.  You have a severe headache.  You have severe pain in your abdomen, your back, or the area between your waist and hips (pelvis).  You have chest pain, shortness of breath, or an irregular or fast heartbeat.  You are unable to urinate, or you urinate less than normal.  You have abnormal bleeding, such as bleeding from the rectum, vagina, nose, lungs, or nipples.  You vomit blood.  You have thoughts about hurting yourself or others. If you ever feel like you may hurt yourself or others, or have thoughts about taking your own life, get help right away. You can go to your nearest emergency department or call:  Your local emergency services (911 in the [place]  A suicide crisis helpline, such as the [facility] at (740)138-6196. This is open 24 hours a day. Summary  If you have fatigue, you feel tired all the time and have a lack of energy or a lack of motivation.  Fatigue may make it difficult to start or complete tasks because of exhaustion.  Long-lasting (chronic) or extreme fatigue may be a symptom of a medical condition.  Exercise regularly, as told by your health care provider.  Change situations that cause you stress. Try to keep your work and personal schedule in balance. This information is not intended to replace advice given to you by your health care provider. Make sure you discuss any questions you have with your health care provider. Document Revised: 08/14/2018 Document Reviewed: 10/18/2016 Elsevier Patient Education  [place], Adult After being diagnosed with an anxiety disorder, you may be relieved to know why you have felt or behaved a certain way. You may also feel overwhelmed about the treatment ahead and what it will mean for your life. With care and support, you can manage this condition and recover from it. How  to manage lifestyle changes Managing stress and anxiety  Stress is your body's reaction to life changes and events, both good and bad. Most stress will last just a few hours, but stress can be ongoing and can lead to more than just stress. Although stress can play a major role in anxiety, it is not the same as anxiety. Stress is usually caused by something external, such as a deadline, test, or competition. Stress normally passes after the triggering event has ended.  Anxiety is caused by something internal, such as imagining a terrible outcome or worrying that something will go wrong that will devastate you. Anxiety often does not go away even after the triggering event is over, and it can become long-term (chronic) worry. It is important to understand the differences between stress and anxiety and to manage your stress effectively so that it does not lead to an anxious response. Talk with your health care provider or a counselor to learn more about reducing anxiety and stress. He or she may suggest tension reduction techniques, such as:  Music therapy. This can include creating or listening to music that you enjoy and that inspires you.  Mindfulness-based meditation. This involves being aware of your normal breaths while not trying to control your breathing. It can be done while sitting or walking.  Centering prayer. This involves focusing on a word, phrase, or sacred image that means something to you and brings you peace.  Deep breathing. To do this, expand your stomach and inhale slowly through your nose. Hold your breath for 3-5 seconds. Then exhale slowly, letting your stomach muscles relax.  Self-talk. This involves identifying thought patterns that lead to anxiety reactions and changing those patterns.  Muscle relaxation. This involves tensing muscles and then relaxing them. Choose a tension reduction technique that suits your lifestyle and personality. These techniques take time and  practice. Set aside 5-15 minutes a day to do them. Therapists can offer counseling and training in these techniques. The training to help with anxiety may be covered by some insurance plans. Other things you can do to manage stress and anxiety include:  Keeping a stress/anxiety diary. This can help you learn what triggers your reaction and then learn ways to manage your response.  Thinking about how you react to certain situations. You may not be able to control everything, but you can control your response.  Making time for activities that help you relax and not feeling guilty about spending your time in this way.  Visual imagery and yoga can help you stay calm and relax.  Medicines Medicines can help ease  symptoms. Medicines for anxiety include:  Anti-anxiety drugs.  Antidepressants. Medicines are often used as a primary treatment for anxiety disorder. Medicines will be prescribed by a health care provider. When used together, medicines, psychotherapy, and tension reduction techniques may be the most effective treatment. Relationships Relationships can play a big part in helping you recover. Try to spend more time connecting with trusted friends and family members. Consider going to couples counseling, taking family education classes, or going to family therapy. Therapy can help you and others better understand your condition. How to recognize changes in your anxiety Everyone responds differently to treatment for anxiety. Recovery from anxiety happens when symptoms decrease and stop interfering with your daily activities at home or work. This may mean that you will start to:  Have better concentration and focus. Worry will interfere less in your daily thinking.  Sleep better.  Be less irritable.  Have more energy.  Have improved memory. It is important to recognize when your condition is getting worse. Contact your health care provider if your symptoms interfere with home or work and  you feel like your condition is not improving. Follow these instructions at home: Activity  Exercise. Most adults should do the following: ? Exercise for at least 150 minutes each week. The exercise should increase your heart rate and make you sweat (moderate-intensity exercise). ? Strengthening exercises at least twice a week.  Get the right amount and quality of sleep. Most adults need 7-9 hours of sleep each night. Lifestyle   Eat a healthy diet that includes plenty of vegetables, fruits, whole grains, low-fat dairy products, and lean protein. Do not eat a lot of foods that are high in solid fats, added sugars, or salt.  Make choices that simplify your life.  Do not use any products that contain nicotine or tobacco, such as cigarettes, e-cigarettes, and chewing tobacco. If you need help quitting, ask your health care provider.  Avoid caffeine, alcohol, and certain over-the-counter cold medicines. These may make you feel worse. Ask your pharmacist which medicines to avoid. General instructions  Take over-the-counter and prescription medicines only as told by your health care provider.  Keep all follow-up visits as told by your health care provider. This is important. Where to find support You can get help and support from these sources:  Self-help groups.  Online and [employer].  A trusted spiritual leader.  Couples counseling.  Family education classes.  Family therapy. Where to find more information You may find that joining a support group helps you deal with your anxiety. The following sources can help you locate counselors or support groups near you:  [place]: www.mentalhealthamerica.net  Anxiety and Depression Association of [place] (ADAA): https://www.clark.net/  National Alliance on Mental Illness (NAMI): www.nami.org Contact a health care provider if you:  Have a hard time staying focused or finishing daily tasks.  Spend many hours a day  feeling worried about everyday life.  Become exhausted by worry.  Start to have headaches, feel tense, or have nausea.  Urinate more than normal.  Have diarrhea. Get help right away if you have:  A racing heart and shortness of breath.  Thoughts of hurting yourself or others. If you ever feel like you may hurt yourself or others, or have thoughts about taking your own life, get help right away. You can go to your nearest emergency department or call:  Your local emergency services (911 in the [place]  A suicide crisis helpline, such as the [place] Su[facility]  [facility] at 5132604748. This is open 24 hours a day. Summary  Taking steps to learn and use tension reduction techniques can help calm you and help prevent triggering an anxiety reaction.  When used together, medicines, psychotherapy, and tension reduction techniques may be the most effective treatment.  Family, friends, and partners can play a big part in helping you recover from an anxiety disorder. This information is not intended to replace advice given to you by your health care provider. Make sure you discuss any questions you have with your health care provider. Document Revised: 06/25/2018 Document Reviewed: 06/25/2018 Elsevier Patient Education  [place].  Substance Use Disorder Substance use disorder occurs when a person's repeated use of drugs or alcohol interferes with his or her ability to be productive. This disorder can cause problems with mental and physical health. It can affect your ability to have healthy relationships, and it can keep you from being able to meet your responsibilities at work, home, or school. It can also lead to addiction, which is a condition in which the person cannot stop using the substance consistently for a period of time. Addiction changes the way the brain works. Because of these changes, addiction is a chronic condition. Substance use disorder can be mild,  moderate, or severe. The most commonly abused substances include:  Alcohol.  Tobacco.  Marijuana.  Stimulants, such as cocaine and methamphetamine.  Hallucinogens, such as LSD and PCP.  Opioids, such as some prescription pain medicines and heroin. What are the causes? This condition may develop due to many complex social, psychological, or physical reasons, such as:  Stress.  Abuse.  Peer pressure.  Anxiety or depression. What increases the risk? This condition is more likely to develop in people who:  Use substances to cope with stress.  Have been abused.  Have a mental health disorder, such as depression.  Have a family history of substance use disorder. What are the signs or symptoms? Symptoms of this condition include:  Using the substance for longer periods of time or at a higher dosage than what is normal or intended.  Having a lasting desire to use the substance.  Being unable to slow down or stop the use of the substance.  Spending an abnormal amount of time getting the substance, using the substance, or recovering from using the substance.  Using the substance in a way that interferes with work, school, social activities, and personal relationships.  Using the substance even after having negative consequences, such as: ? Health problems. ? Legal or financial troubles. ? Job loss. ? Relationship problems.  Needing more and more of the substance to get the same effect (developing tolerance).  Experiencing unpleasant symptoms if you do not use the substance (withdrawal).  Using the substance to avoid withdrawal symptoms. How is this diagnosed? This condition may be diagnosed based on:  A physical exam.  Your history of substance use.  Your symptoms. This includes: ? How substance use affects your life. ? Changes in personality, behaviors, and mood. ? Having at least two symptoms of substance use disorder within a 13-monthperiod. ? Health  issues related to substance use, such as liver damage, shortness of breath, fatigue, cough, or heart problems.  Blood or urine tests to screen for alcohol and drugs. How is this treated? This condition may be treated by:  Stopping substance use safely. This may require taking medicines and being closely monitored for several days.  Taking part in group and individual counseling  from mental health providers who help people with substance use disorder.  Staying at a live-in (residential) treatment center for several days or weeks.  Attending daily counseling sessions at a treatment center.  Taking medicine as told by your health care provider: ? To ease symptoms and prevent complications during withdrawal. ? To treat other mental health issues, such as depression or anxiety. ? To block cravings by causing the same effects as the substance. ? To block the effects of the substance or replace good sensations with unpleasant ones.  Participating in a support group to share your experience with others who are going through the same thing. These groups are an important part of long-term recovery for many people. Recovery can be a long process. Many people who undergo treatment start using the substance again after stopping (relapse). If you relapse, that does not mean that treatment will not work. Follow these instructions at home:   Take over-the-counter and prescription medicines only as told by your health care provider.  Do not use any drugs or alcohol.  Avoid temptations or triggers that you associate with your use of the substance.  Learn and practice techniques for managing stress.  Have a plan for vulnerable moments. Get phone numbers of people who are willing to help and who are committed to your recovery.  Attend support groups on a regular basis. These groups include 12-step programs like Alcoholics Anonymous and Narcotics Anonymous.  Keep all follow-up visits as told by your  health care providers. This is important. This includes continuing to work with therapists and support groups. Contact a health care provider if:  You cannot take your medicines as told.  Your symptoms get worse.  You have trouble resisting the urge to use drugs or alcohol. Get help right away if you:  Relapse.  Think that you may have taken too much of a drug. The hotline of the [facility] is 3161143366.  Have signs of an overdose. Symptoms include: ? Chest pain. ? Confusion. ? Sleepiness or difficulty staying awake. ? Slowed breathing. ? Nausea or vomiting. ? A seizure.  Have serious thoughts about hurting yourself or someone else. Drug overdose is an emergency. Do not wait to see if the symptoms will go away. Get medical help right away. Call your local emergency services (911 in the [place] Do not drive yourself to the hospital. If you ever feel like you may hurt yourself or others, or have thoughts about taking your own life, get help right away. You can go to your nearest emergency department or call:  Your local emergency services (911 in the [place]  A suicide crisis helpline, such as the [facility] at 403-135-9390. This is open 24 hours a day. Summary  Substance use disorder occurs when a person's repeated use of drugs or alcohol interferes with his or her ability to be productive.  Taking part in group and individual counseling from mental health providers is a common treatment for people with substance use disorder.  Recovery can be a long process. Many people who undergo treatment start using the substance again after stopping (relapse). A relapse does not mean that treatment will not work.  Attend support groups such as Alcoholics Anonymous and Narcotics Anonymous. These groups are an important part of long-term recovery for many people. This information is not intended to replace advice given to you by your health  care provider. Make sure you discuss any questions you have with your health  care provider. Document Revised: 05/16/2018 Document Reviewed: 03/06/2017 Elsevier Patient Education  2020 [employer].

## 2019-08-26 NOTE — Progress Notes (Addendum)
Established Patient Office Visit  Subjective:  Patient ID: Andrew Parsons, male    DOB: Jul 10, 1980  Age: 39 y.o. MRN: [id number]  CC:  Chief Complaint  Patient presents with  . New Patient (Initial Visit)    establish care    HPI Andrew Parsons this 39 year old comes in to establish care with primary care provider.  He has had no routine medical care in many years.  He has been seen in the emergency department several times over the years for back pain, perforated eardrum, abscess.    Concerns about 10 plus year hx of falling asleep standing up and driving.  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 Sleeps a full day and wakes up tired.    Fainting: He notes 2 mos ago and he was playing with nephew and felt like something was wrong and sat down- breathing hard and started sweating.  He thinks he may have passed out a second because he says I came back.  He felt his heart racing and had fatigue.  He took a nap after that and woke up feeling ok.  He does not think that he was taking any illicit drugs surrounding this timeframe.  No chest pain or pressure/heaviness, n/v. He has a poor memory for many years. However, it is getting worse. He recently woke up beside his girlfriend and he didn't know who she was. They had been together for 4-5 years.  He has not had this problem of knowing people when awake during the day.  However his daughter today reports that he has had serious problems with his memory.  Substance abuse: Last used crystal meth a few days ago. He also uses cocaine.  He denies any IV drug use.  He will go on jags where he will drink large amounts of alcohol and then not use the meth or cocaine and then switch back to meth and not drink alcohol.  He has withdrawals if he tries to quit.  He can recall no seizures.  He has not been in a formal drug program. He reports this is the first time he is coming into seek help with the urging of his daughter.  He denies history of DUI or any court  mandated problems related to his substance abuse.  He is currently employed, staying with a friend right now.   Past Medical History:  Diagnosis Date  . Alcohol abuse   . Anxiety 08/26/2019  . Atypical syncope 08/26/2019  . Depression   . Fainting spell   . Fatigue 08/26/2019  . Hyperlipidemia   . Hypertension   . Polysubstance (excluding opioids) dependence (HCC) 08/26/2019  . Sleep disturbance 08/26/2019    Past Surgical History:  Procedure Laterality Date  . TYMPANOPLASTY      Family History  Problem Relation Age of Onset  . Early death Mother   . Stroke Mother   . Depression Daughter   . Hyperlipidemia Daughter     Social History   Socioeconomic History  . Marital status: Single    Spouse name: Not on file  . Number of children: Not on file  . Years of education: Not on file  . Highest education level: GED or equivalent  Occupational History  . Occupation: Administrator   Tobacco Use  . Smoking status: Current Every Day Smoker    Packs/day: 0.50    Types: Cigarettes  . Smokeless tobacco: Never Used  Vaping Use  . Vaping Use: Never used  Substance and Sexual Activity  . Alcohol use: Yes    Comment: not now but has abused it the past and episodic binge   . Drug use: Not Currently    Types: Methamphetamines, Cocaine, Marijuana    Comment: Substance use since 39 yo   . Sexual activity: Yes  Other Topics Concern  . Not on file  Social History Narrative   Lives with a friend . He has 2 children and grandkids .    Social Determinants of Health   Financial Resource Strain:   . Difficulty of Paying Living Expenses:   Food Insecurity:   . Worried About Programme researcher, broadcasting/film/videounning Out of Food in the Last Year:   . Baristaan Out of Food in the Last Year:   Transportation Needs:   . Freight forwarderLack of Transportation (Medical):   Marland Kitchen. Lack of Transportation (Non-Medical):   Physical Activity:   . Days of Exercise per Week:   . Minutes of Exercise per Session:   Stress:   . Feeling of Stress :    Social Connections:   . Frequency of Communication with Friends and Family:   . Frequency of Social Gatherings with Friends and Family:   . Attends Religious Services:   . Active Member of Clubs or Organizations:   . Attends BankerClub or Organization Meetings:   Marland Kitchen. Marital Status:   Intimate Partner Violence:   . Fear of Current or Ex-Partner:   . Emotionally Abused:   Marland Kitchen. Physically Abused:   . Sexually Abused:     Outpatient Medications Prior to Visit  Medication Sig Dispense Refill  . cyclobenzaprine (FLEXERIL) 10 MG tablet Take 1 tablet (10 mg total) by mouth 3 (three) times daily as needed for muscle spasms. 30 tablet 0  . naproxen (NAPROSYN) 500 MG tablet Take 1 tablet (500 mg total) by mouth 2 (two) times daily with a meal. 30 tablet 0   No facility-administered medications prior to visit.    No Known Allergies  Review of Systems  Constitutional: Positive for fatigue. Negative for chills, fever and unexpected weight change.  HENT: Negative for congestion, ear pain, sinus pressure and sore throat.   Eyes: Negative.   Respiratory: Negative for cough, shortness of breath and wheezing.   Cardiovascular: Negative for chest pain, palpitations and leg swelling.  Gastrointestinal: Negative.   Endocrine: Negative.   Genitourinary: Negative.   Musculoskeletal: Negative.   Skin: Negative for rash.  Neurological: Positive for syncope and headaches. Negative for dizziness, tremors, seizures, facial asymmetry, light-headedness and numbness.  Hematological: Negative for adenopathy. Does not bruise/bleed easily.  Psychiatric/Behavioral:       Poly substance abuse - active.  Positive untreated anxiety/depression. No SI/HI. Works out in [employer] gym regularly.       Objective:    Physical Exam Vitals reviewed.  Constitutional:      Appearance: Normal appearance. He is normal weight.  HENT:     Head: Normocephalic and atraumatic.  Eyes:     Extraocular Movements: Extraocular movements  intact.     Conjunctiva/sclera: Conjunctivae normal.     Pupils: Pupils are equal, round, and reactive to light.  Cardiovascular:     Rate and Rhythm: Normal rate and regular rhythm.     Pulses: Normal pulses.     Heart sounds: Normal heart sounds.  Pulmonary:     Effort: Pulmonary effort is normal.     Breath sounds: Normal breath sounds.  Abdominal:     General: Abdomen is flat.     Tenderness:  There is no abdominal tenderness.  Musculoskeletal:        General: Normal range of motion.     Cervical back: Normal range of motion and neck supple.  Skin:    General: Skin is warm and dry.  Neurological:     General: No focal deficit present.     Mental Status: He is alert and oriented to person, place, and time.     Cranial Nerves: No cranial nerve deficit.     Sensory: No sensory deficit.     Motor: No weakness.     Coordination: Coordination normal.     Gait: Gait normal.     Deep Tendon Reflexes: Reflexes normal.     Comments: Poor memory noted- Dtr gives half of the history.   Psychiatric:        Mood and Affect: Mood is not anxious. Affect is tearful.        Speech: Speech normal.        Behavior: Behavior normal. Behavior is cooperative.        Thought Content: Thought content is not paranoid. Thought content does not include homicidal or suicidal ideation.        Cognition and Memory: Memory is impaired.     BP 120/82 (BP Location: Left Arm, Patient Position: Sitting, Cuff Size: Normal)   Pulse (!) 102   Temp 98 F (36.7 C) (Oral)   Ht 5\' 11"  (1.803 m)   Wt 213 lb (96.6 kg)   SpO2 97%   BMI 29.71 kg/m  Wt Readings from Last 3 Encounters:  08/26/19 213 lb (96.6 kg)  01/23/17 215 lb (97.5 kg)  12/16/15 210 lb (95.3 kg)     Health Maintenance Due  Topic Date Due  . Hepatitis C Screening  Never done  . COVID-19 Vaccine (1) Never done  . HIV Screening  Never done    There are no preventive care reminders to display for this patient.  Lab Results  Component  Value Date   TSH 2.31 08/26/2019   Lab Results  Component Value Date   WBC 6.8 08/26/2019   HGB 14.5 08/26/2019   HCT 41.4 08/26/2019   MCV 89.0 08/26/2019   PLT 205.0 08/26/2019   Lab Results  Component Value Date   NA 138 08/26/2019   K 3.8 08/26/2019   CO2 29 08/26/2019   GLUCOSE 106 (H) 08/26/2019   BUN 15 08/26/2019   CREATININE 1.12 08/26/2019   BILITOT 0.4 08/26/2019   ALKPHOS 68 08/26/2019   AST 23 08/26/2019   ALT 36 08/26/2019   PROT 7.3 08/26/2019   ALBUMIN 4.4 08/26/2019   CALCIUM 9.3 08/26/2019   ANIONGAP 7 07/27/2015   GFR 87.42 08/26/2019   Lab Results  Component Value Date   CHOL 191 08/26/2019   Lab Results  Component Value Date   HDL 48.40 08/26/2019   Lab Results  Component Value Date   LDLCALC 114 (H) 08/26/2019   Lab Results  Component Value Date   TRIG 146.0 08/26/2019   Lab Results  Component Value Date   CHOLHDL 4 08/26/2019   Lab Results  Component Value Date   HGBA1C 5.8 08/26/2019      Assessment & Plan:   Problem List Items Addressed This Visit      Cardiovascular and Mediastinum   Atypical syncope   Relevant Orders   Lipid panel (Completed)   Ambulatory referral to Neurology   EKG 12-Lead (Completed)   Ambulatory referral to Cardiology  Other   Polysubstance (excluding opioids) dependence (HCC)   Relevant Orders   Hepatitis C antibody   HIV Antibody (routine testing w rflx)   RPR   Sleep disturbance   Relevant Orders   Ambulatory referral to Neurology   Anxiety   Fatigue   Relevant Orders   CBC with Differential/Platelet (Completed)   TSH (Completed)   Comprehensive metabolic panel (Completed)   Hemoglobin A1c (Completed)   VITAMIN D 25 Hydroxy (Vit-D Deficiency, Fractures) (Completed)      No orders of the defined types were placed in this encounter.  An EKG was obtained today for atypical syncope. Study revealed normal sinus rhythm with rate 81.  No ST-T wave changes.  Could not bring up prior  EKG from 07/27/2015 to compare.  Impression: This is a normal EKG.  Please go to the lab today.  I placed referral sent to neurology to evaluate your passing out spells, falling asleep while standing up, concern over poor memory, and sleep apnea studies.  I placed referral to cardiology regarding your passing out spells, racing heart, and sweating spells.  I recommend that you talk to [facility] [phone] for  substance abuse assistance and depression/anxiety. We discussed that they have good psychiatrists at the site and he can get immediate assistance. He can also try [facility]- [phone].  Please make an office visit with me in 1 week to go over results make further recommendations.  Follow-up: Return in about 1 week (around 09/02/2019).   This visit occurred during the SARS-CoV-2 public health emergency.  Safety protocols were in place, including screening questions prior to the visit, additional usage of staff PPE, and extensive cleaning of exam room while observing appropriate contact time as indicated for disinfecting solutions.   Amedeo Kinsman, NP

## 2019-08-27 LAB — HEPATITIS C ANTIBODY
Hepatitis C Ab: NONREACTIVE
SIGNAL TO CUT-OFF: 0.02 (ref <1.00)

## 2019-08-27 LAB — HIV ANTIBODY (ROUTINE TESTING W REFLEX): HIV 1&2 Ab, 4th Generation: NONREACTIVE

## 2019-08-27 LAB — RPR: RPR Ser Ql: NONREACTIVE

## 2019-08-28 ENCOUNTER — Encounter: Payer: Self-pay | Admitting: Nurse Practitioner

## 2019-09-02 ENCOUNTER — Encounter: Payer: Self-pay | Admitting: Nurse Practitioner

## 2019-09-02 ENCOUNTER — Other Ambulatory Visit: Payer: Self-pay

## 2019-09-02 ENCOUNTER — Ambulatory Visit (INDEPENDENT_AMBULATORY_CARE_PROVIDER_SITE_OTHER): Payer: BC Managed Care – PPO | Admitting: Nurse Practitioner

## 2019-09-02 VITALS — BP 150/84 | HR 103 | Temp 98.1°F | Ht 71.0 in | Wt 211.0 lb

## 2019-09-02 DIAGNOSIS — G479 Sleep disorder, unspecified: Secondary | ICD-10-CM

## 2019-09-02 DIAGNOSIS — G8929 Other chronic pain: Secondary | ICD-10-CM | POA: Insufficient documentation

## 2019-09-02 DIAGNOSIS — R7303 Prediabetes: Secondary | ICD-10-CM | POA: Diagnosis not present

## 2019-09-02 DIAGNOSIS — F1920 Other psychoactive substance dependence, uncomplicated: Secondary | ICD-10-CM | POA: Diagnosis not present

## 2019-09-02 DIAGNOSIS — R030 Elevated blood-pressure reading, without diagnosis of hypertension: Secondary | ICD-10-CM

## 2019-09-02 DIAGNOSIS — F419 Anxiety disorder, unspecified: Secondary | ICD-10-CM

## 2019-09-02 DIAGNOSIS — R55 Syncope and collapse: Secondary | ICD-10-CM

## 2019-09-02 DIAGNOSIS — R519 Headache, unspecified: Secondary | ICD-10-CM

## 2019-09-02 DIAGNOSIS — R7989 Other specified abnormal findings of blood chemistry: Secondary | ICD-10-CM | POA: Insufficient documentation

## 2019-09-02 NOTE — Progress Notes (Signed)
Established Patient Office Visit  Subjective:  Patient ID: Andrew Parsons, male    DOB: April 17, 1980  Age: 39 y.o. MRN: [id number]  CC:  Chief Complaint  Patient presents with  . Follow-up    discuss labs    HPI Andrew Parsons is a 39 year old patient with active polysubstance abuse established care last week for sleep disturbance with concerns of narcolepsy, anxiety, fatigue, poor memory, intermittent HA. He had a possible syncopal event associated with racing heart and fatigue 2 mos ago and today states drug use was likely associated with that event. He comes in with his daughter, Andrew Parsons. She is to be called with appt referrals: [phone].   He had routine blood work done last week.  He was referred to neurology for intermittent headache, memory issues, sleep disturbance with concern of narcolepsy.  The referral was rejected by Dr. Juluis Mire group as they cannot see a patient who is actively abusing  drugs or alcohol.    He says that he cannot stop using drugs for more than 24 hours before he gets sweats and can't sleep. He may have manic episodes, but does not recall that he sinks into depression episodes.  He does have problems with anger management.  He has been known to punch walls and break things, but has never hurt people and denies any homicidal ideation.  He denies any suicidal ideation. He has not been in favor of inpatient rehab up until this point.  He plans to see [facility] tomorrow. We discussed that in patient treatment will likely be his best option if he wants to stop using meth and alcohol. He does not think he is addicted to cocaine.   He has not had a headache over the last week. He gets HA when goes long hours without sleep. He works full-time, 12 hour shifts and then goes to the gym to work out for 1-2 hours. He does not get lost driving and 2 times woke up and did not know girlfriend, but admits that was likely drug related.   08/26/2019:Laboratory:  -HIV: Non-reactive -HCV:  Non-reactive -RPR: Non-reactive -TSH: 2.31 -Vit D: 22.39  Lab Results  Component Value Date   ALT 36 08/26/2019   AST 23 08/26/2019   ALKPHOS 68 08/26/2019   BILITOT 0.4 08/26/2019   Lab Results  Component Value Date   HGBA1C 5.8 08/26/2019   Pre diabetes: At risk A1c 5.8 Already gym going- healthy-no etoh.  Low vit D: Advised to begin 400 IU daily   Past Medical History:  Diagnosis Date  . Alcohol abuse   . Anxiety 08/26/2019  . Atypical syncope 08/26/2019  . Depression   . Fainting spell   . Fatigue 08/26/2019  . Hyperlipidemia   . Hypertension   . Polysubstance (excluding opioids) dependence (HCC) 08/26/2019  . Sleep disturbance 08/26/2019    Past Surgical History:  Procedure Laterality Date  . TYMPANOPLASTY      Family History  Problem Relation Age of Onset  . Early death Mother   . Stroke Mother   . Depression Daughter   . Hyperlipidemia Daughter     Social History   Socioeconomic History  . Marital status: Single    Spouse name: Not on file  . Number of children: Not on file  . Years of education: Not on file  . Highest education level: GED or equivalent  Occupational History  . Occupation: Administrator   Tobacco Use  . Smoking status: Current Every Day Smoker  Packs/day: 0.50    Types: Cigarettes  . Smokeless tobacco: Never Used  Vaping Use  . Vaping Use: Never used  Substance and Sexual Activity  . Alcohol use: Yes    Comment: not now but has abused it the past and episodic binge   . Drug use: Not Currently    Types: Methamphetamines, Cocaine, Marijuana    Comment: Substance use since 39 yo   . Sexual activity: Yes  Other Topics Concern  . Not on file  Social History Narrative   Lives with a friend . He has 2 children and grandkids .    Social Determinants of Health   Financial Resource Strain:   . Difficulty of Paying Living Expenses:   Food Insecurity:   . Worried About Programme researcher, broadcasting/film/videounning Out of Food in the Last Year:   . Baristaan Out of Food  in the Last Year:   Transportation Needs:   . Freight forwarderLack of Transportation (Medical):   Marland Kitchen. Lack of Transportation (Non-Medical):   Physical Activity:   . Days of Exercise per Week:   . Minutes of Exercise per Session:   Stress:   . Feeling of Stress :   Social Connections:   . Frequency of Communication with Friends and Family:   . Frequency of Social Gatherings with Friends and Family:   . Attends Religious Services:   . Active Member of Clubs or Organizations:   . Attends BankerClub or Organization Meetings:   Marland Kitchen. Marital Status:   Intimate Partner Violence:   . Fear of Current or Ex-Partner:   . Emotionally Abused:   Marland Kitchen. Physically Abused:   . Sexually Abused:     No outpatient medications prior to visit.   No facility-administered medications prior to visit.    No Known Allergies   Review of Systems  Constitutional: Negative for chills and fever.  HENT: Negative.   Eyes: Negative.   Respiratory: Negative for cough and shortness of breath.   Cardiovascular: Negative for chest pain.  Gastrointestinal: Negative for abdominal pain and blood in stool.  Genitourinary: Negative for difficulty urinating.  Musculoskeletal: Negative for arthralgias and neck pain.  Skin: Negative for rash.  Allergic/Immunologic: Negative.   Neurological: Positive for syncope and headaches. Negative for dizziness, tremors, seizures, facial asymmetry, speech difficulty, weakness, light-headedness and numbness.  Hematological: Negative.   Psychiatric/Behavioral:       See HPI. No SI/HI.       Objective:    Physical Exam Vitals reviewed.  Constitutional:      Appearance: Normal appearance.  HENT:     Head: Normocephalic.  Cardiovascular:     Rate and Rhythm: Normal rate and regular rhythm.     Pulses: Normal pulses.     Heart sounds: Normal heart sounds.  Pulmonary:     Effort: Pulmonary effort is normal.     Breath sounds: Normal breath sounds.  Musculoskeletal:        General: Normal range of  motion.     Cervical back: Normal range of motion.  Skin:    General: Skin is warm and dry.  Neurological:     General: No focal deficit present.     Mental Status: He is alert and oriented to person, place, and time.  Psychiatric:        Mood and Affect: Mood normal.        Behavior: Behavior normal.     BP (!) 150/84 (BP Location: Left Arm, Patient Position: Sitting, Cuff Size: Normal)   Pulse 103  Temp 98.1 F (36.7 C) (Oral)   Ht 5\' 11"  (1.803 m)   Wt (!) 211 lb (95.7 kg)   SpO2 98%   BMI 29.43 kg/m  Wt Readings from Last 3 Encounters:  09/02/19 (!) 211 lb (95.7 kg)  08/26/19 213 lb (96.6 kg)  01/23/17 215 lb (97.5 kg)     Health Maintenance Due  Topic Date Due  . COVID-19 Vaccine (1) Never done    There are no preventive care reminders to display for this patient.  Lab Results  Component Value Date   TSH 2.31 08/26/2019   Lab Results  Component Value Date   WBC 6.8 08/26/2019   HGB 14.5 08/26/2019   HCT 41.4 08/26/2019   MCV 89.0 08/26/2019   PLT 205.0 08/26/2019   Lab Results  Component Value Date   NA 138 08/26/2019   K 3.8 08/26/2019   CO2 29 08/26/2019   GLUCOSE 106 (H) 08/26/2019   BUN 15 08/26/2019   CREATININE 1.12 08/26/2019   BILITOT 0.4 08/26/2019   ALKPHOS 68 08/26/2019   AST 23 08/26/2019   ALT 36 08/26/2019   PROT 7.3 08/26/2019   ALBUMIN 4.4 08/26/2019   CALCIUM 9.3 08/26/2019   ANIONGAP 7 07/27/2015   GFR 87.42 08/26/2019   Lab Results  Component Value Date   CHOL 191 08/26/2019   Lab Results  Component Value Date   HDL 48.40 08/26/2019   Lab Results  Component Value Date   LDLCALC 114 (H) 08/26/2019   Lab Results  Component Value Date   TRIG 146.0 08/26/2019   Lab Results  Component Value Date   CHOLHDL 4 08/26/2019   Lab Results  Component Value Date   HGBA1C 5.8 08/26/2019      Assessment & Plan:   Problem List Items Addressed This Visit      Cardiovascular and Mediastinum   Atypical syncope    A  cardiology referral was placed at last visit. We discussed the fact that his drug abuse is likely a main cause of his syncope symptoms 2 mos ago.  Patient admits that he was probably high when this occurred. He has had no recurrence.         Other   Polysubstance (excluding opioids) dependence (HCC) - Primary   Relevant Orders   Ambulatory referral to Neurology   Sleep disturbance    He has sleep disturbance that could be associated with substance abuse.  He also has components of narcolepsy.       Relevant Orders   Ambulatory referral to Neurology   Anxiety   Pre-diabetes    Continue to work on healthy lifestyle for prediabetes.        Low vitamin D level    Supplemented vitamin D with 400 IU daily recommended.      Chronic nonintractable headache    He reports headaches a couple times a month, usually associated with long jacks without sleep, could also be alcohol and meth or cocaine related.  He treats with Tylenol or Advil and sleep and resolves.  He has had no seizures.      Relevant Orders   Ambulatory referral to Neurology   Elevated BP without diagnosis of hypertension    Please purchase a blood pressure cuff and people like [employer] brand and it can be found at [facility].  Please check his heart rate and blood pressure at home and write those down.   BP Readings from Last 3 Encounters:  09/02/19 (!) 150/84  08/26/19 120/82  01/23/17 137/77            No orders of the defined types were placed in this encounter.  We discussed the fact that a lot of his problems are likely related to his drug abuse.  We discussed the role of inpatient treatment versus outpatient treatment.  See list below for potential treatment centers.  He wants to start with [facility].  Please see [facility] tomorrow as planned and make sure they do have office hours tomorrow by calling [phone].  Another local run group in town for addiction services is [facility] and they have office hours Monday through  Friday from 9-4 in person [phone]  There are local treatment centers for addiction: 1.  [facility] has outpatient and residential care in [place] 1 [phone] 2.  [facility], [facility] [phone] 3.  [facility] [phone]  Please return to the office visit in 3 months for recheck. Recommend B12, folate, thiamine/B1 with next lab draw.  He was advised to get Covid vaccine.  His tetanus is up-to-date.  Follow-up: Return in about 3 months (around 12/03/2019).  This visit occurred during the SARS-CoV-2 public health emergency.  Safety protocols were in place, including screening questions prior to the visit, additional usage of staff PPE, and extensive cleaning of exam room while observing appropriate contact time as indicated for disinfecting solutions.    Amedeo Kinsman, NP

## 2019-09-02 NOTE — Patient Instructions (Addendum)
I will replace the neurology consult for sleep problems, memory issues, and chronic intermittent headaches.  Please see [facility] tomorrow as planned and make sure they do have office hours tomorrow by calling [phone].  Another local run group in town for addiction services is [facility] and they have office hours Monday through Friday from 9-4 in person [phone]  There are local treatment centers for addiction: 1.  [facility] has outpatient and residential care in [place] 1 [phone] 2.  [facility], [facility] [phone] 3.  [facility] [phone]  Please purchase a blood pressure cuff and people like OMRON brand and it can be found at [facility].  Please check his heart rate and blood pressure at home and write those down.  If he continues to have elevated readings, I would like to treat with medication.   Continue to work on healthy lifestyle for prediabetes  Start taking a vitamin D3 400 IU supplement once daily   Please return to the office visit in 3 months for recheck.  Prediabetes Eating Plan Prediabetes is a condition that causes blood sugar (glucose) levels to be higher than normal. This increases the risk for developing diabetes. In order to prevent diabetes from developing, your health care provider may recommend a diet and other lifestyle changes to help you:  Control your blood glucose levels.  Improve your cholesterol levels.  Manage your blood pressure. Your health care provider may recommend working with a diet and nutrition specialist (dietitian) to make a meal plan that is best for you. What are tips for following this plan? Lifestyle  Set weight loss goals with the help of your health care team. It is recommended that most people with prediabetes lose 7% of their current body weight.  Exercise for at least 30 minutes at least 5 days a week.  Attend a support group or seek ongoing  support from a mental health counselor.  Take over-the-counter and prescription medicines only as told by your health care provider. Reading food labels  Read food labels to check the amount of fat, salt (sodium), and sugar in prepackaged foods. Avoid foods that have: ? Saturated fats. ? Trans fats. ? Added sugars.  Avoid foods that have more than 300 milligrams (mg) of sodium per serving. Limit your daily sodium intake to less than 2,300 mg each day. Shopping  Avoid buying pre-made and processed foods. Cooking  Cook with olive oil. Do not use butter, lard, or ghee.  Bake, broil, grill, or boil foods. Avoid frying. Meal planning   Work with your dietitian to develop an eating plan that is right for you. This may include: ? Tracking how many calories you take in. Use a food diary, notebook, or mobile application to track what you eat at each meal. ? Using the glycemic index (GI) to plan your meals. The index tells you how quickly a food will raise your blood glucose. Choose low-GI foods. These foods take a longer time to raise blood glucose.  Consider following a Mediterranean diet. This diet includes: ? Several servings each day of fresh fruits and vegetables. ? Eating fish at least twice a week. ? Several servings each day of whole grains, beans, nuts, and seeds. ? Using olive oil instead of other fats. ? Moderate alcohol consumption. ? Eating small amounts of red meat and whole-fat dairy.  If you have high blood pressure, you may need to limit your sodium intake or follow a diet such as  the DASH eating plan. DASH is an eating plan that aims to lower high blood pressure. What foods are recommended? The items listed below may not be a complete list. Talk with your dietitian about what dietary choices are best for you. Grains Whole grains, such as whole-wheat or whole-grain breads, crackers, cereals, and pasta. Unsweetened oatmeal. Bulgur. Barley. Quinoa. Brown rice. Corn or  whole-wheat flour tortillas or taco shells. Vegetables Lettuce. Spinach. Peas. Beets. Cauliflower. Cabbage. Broccoli. Carrots. Tomatoes. Squash. Eggplant. Herbs. Peppers. Onions. Cucumbers. Brussels sprouts. Fruits Berries. Bananas. Apples. Oranges. Grapes. Papaya. Mango. Pomegranate. Kiwi. Grapefruit. Cherries. Meats and other protein foods Seafood. Poultry without skin. Lean cuts of pork and beef. Tofu. Eggs. Nuts. Beans. Dairy Low-fat or fat-free dairy products, such as yogurt, cottage cheese, and cheese. Beverages Water. Tea. Coffee. Sugar-free or diet soda. Seltzer water. Lowfat or no-fat milk. Milk alternatives, such as soy or almond milk. Fats and oils Olive oil. Canola oil. Sunflower oil. Grapeseed oil. Avocado. Walnuts. Sweets and desserts Sugar-free or low-fat pudding. Sugar-free or low-fat ice cream and other frozen treats. Seasoning and other foods Herbs. Sodium-free spices. Mustard. Relish. Low-fat, low-sugar ketchup. Low-fat, low-sugar barbecue sauce. Low-fat or fat-free mayonnaise. What foods are not recommended? The items listed below may not be a complete list. Talk with your dietitian about what dietary choices are best for you. Grains Refined white flour and flour products, such as bread, pasta, snack foods, and cereals. Vegetables Canned vegetables. Frozen vegetables with butter or cream sauce. Fruits Fruits canned with syrup. Meats and other protein foods Fatty cuts of meat. Poultry with skin. Breaded or fried meat. Processed meats. Dairy Full-fat yogurt, cheese, or milk. Beverages Sweetened drinks, such as sweet iced tea and soda. Fats and oils Butter. Lard. Ghee. Sweets and desserts Baked goods, such as cake, cupcakes, pastries, cookies, and cheesecake. Seasoning and other foods Spice mixes with added salt. Ketchup. Barbecue sauce. Mayonnaise. Summary  To prevent diabetes from developing, you may need to make diet and other lifestyle changes to help  control blood sugar, improve cholesterol levels, and manage your blood pressure.  Set weight loss goals with the help of your health care team. It is recommended that most people with prediabetes lose 7 percent of their current body weight.  Consider following a Mediterranean diet that includes plenty of fresh fruits and vegetables, whole grains, beans, nuts, seeds, fish, lean meat, low-fat dairy, and healthy oils. This information is not intended to replace advice given to you by your health care provider. Make sure you discuss any questions you have with your health care provider. Document Revised: 05/17/2018 Document Reviewed: 03/29/2016 Elsevier Patient Education  2020 [place] With Anxiety Anxiety disorders are mental health conditions that cause overwhelming feelings of nervousness or worry. These feelings interfere with daily activities and relationships. Anxiety disorders include:  Generalized anxiety disorder (GAD).  Social anxiety.  Post-traumatic stress disorder (PTSD). When a person has an anxiety disorder, his or her condition can affect others around him or her, such as friends and family members. Friends and family can help by offering support and understanding. What do I need to know about this condition? Anxiety is the mental and physical experience of nervousness or worry that you might feel when you think about a stressful event. Occasional anxiety is normal, but a person with an anxiety disorder becomes preoccupied with this worry. He or she may know that the anxiety is not logical, but knowing this does not relieve the discomfort that  he or she feels. Anxiety disorders cause a great deal of distress and prevent someone from having a normal daily life. Someone with an anxiety disorder may:  Experience anxiety that: ? May or may not have a specific trigger. ? Lasts for long periods of time. ? Causes physical problems over time. ? Is far more  intense than normal anticipation. ? Occurs at unpredictable times.  Feel restless or edgy.  Get fatigued easily.  Have trouble focusing.  Have muscle tension.  Have trouble falling asleep or staying asleep.  Be irritable and occasionally have sudden expressions of strong feelings (outbursts).  Have worries that do not make sense to you. What do I need to know about the treatment options? Anxiety disorders are generally very treatable by mental health providers such as psychologists, psychiatrists, and clinical social workers. Treatment may include one or more of the following:  Psychotherapy, also called talk therapy or counseling. Types of psychotherapy that are used to treat anxiety include: ? Cognitive behavioral therapy (CBT). This type of therapy teaches a person how to recognize unhealthy feelings, thoughts, and behaviors, and how to replace those feelings with positive thoughts and actions. ? Behavior therapy that trains a person to relax and self-soothe. This also involves gradually exposing the person to the cause of the anxiety (progressive exposure therapy). ? Biofeedback. This type of therapy focuses on trying to control certain body functions, like heart rate, to lessen the physical impact of anxiety. ? Mindfulness-based stress reduction training. This uses education, meditation, and yoga to help a person stay focused on the present instead of living in the past or worrying about the future. ? Acceptance and commitment therapy (ACT). This helps a person to focus on acceptance, rather than trying to control every situation. ? Family therapy. This treatment helps family members to communicate and deal with conflict in healthy ways.  Medicine to treat anxiety and help to control certain emotions and behaviors.  Mind-body programs. These programs encourage the person with anxiety to be involved in his or her treatment and feel empowered. Mind-body programs may include  mindfulness-based stress reduction training, yoga, or tai chi. How can I create a safe environment?  For certain types of anxiety, such as PTSD, you may want to: ? Remove alcohol and prescription medicines from your loved one's home, or limit the amount of these substances in the home. This can help to prevent your loved one from abusing alcohol and prescription medicines. ? Remove or lock up guns and other weapons. If you do not have a safe place to keep a gun, local law enforcement may store a gun for you.  Make a written crisis plan. Include important phone numbers, such as the local crisis intervention team. Make sure that: ? The person with anxiety knows about this plan and agrees with it. ? Everyone who has regular contact with the person knows about the plan and knows what to do in an emergency. ? The written plan is easily accessible and can be quickly put into action. How should I care for myself? It is important to find ways to care for your body, mind, and well-being while supporting someone with anxiety.  Try to maintain your normal routines. This can help you remember that your life is about more than your loved one's condition.  Understand what your limits are. Say "no" to requests or events that lead to a schedule that is too busy.  Make time for activities that help you relax, and try  to not feel guilty about taking time for yourself.  Spend time with friends and family.  Consider trying meditation and deep breathing exercises to lower your stress. Attend some mind-body classes by yourself or with your loved one.  Get plenty of sleep.  Exercise, even if it is just taking a short walk a few times a week.  If you are struggling emotionally with guilt, fear, or anger, consider working with a therapist. What are some signs that the condition is getting worse? Signs that your loved one's condition may be getting worse include:  Dramatic mood swings.  Staying away from  activities that he or she used to enjoy.  Drinking more alcohol than normal.  Either seeming tearful or seeming to lack emotion.  Talking about "not feeling right."  Staying away from others (isolating himself or herself). Where to find support Talk about the condition Good communication is the key to supporting your friend or family member. Here are a few things to keep in mind:  Be careful about too much prodding. Try not to overdo reminders to an adult friend or family member about things like taking medicines. Ask how your loved one prefers that you help.  Ask questions and then listen to your loved one's response. Be available if your friend or family member wants to talk, but give your loved one space if he or she does not feel like talking.  Never ignore comments about suicide, and do not try to avoid the subject of suicide. Talking about suicide will not make your loved one want to act on it. You or your loved one can reach out 24 hours a day to get free, private support (on the phone or a live online chat) from a suicide crisis helpline, such as the [place] at 437-597-6173.  Be encouraging and offer emotional support. This can help to lower stress. Even saying something simple to comfort your loved one may help.  If your loved one is open to it, go with him or her to visits with a counselor or health care provider. Get suggestions directly from your loved one's care providers about when to get help if you are concerned about behavior changes. Privacy laws limit how much a person's health care provider can share with you without your loved one's permission, but if you feel that a situation is an emergency, do not wait to call a health care provider or emergency services. Find support and resources  Consider joining self-help and support groups, not only for your friend or family member, but also for yourself. People in these peer and family support  groups understand what you and your loved one are going through. They can help you feel a sense of hope and connect you with local resources to help you learn more. ? You may also consider family therapy. General support  Make an effort to learn all you can about your loved one's form of anxiety.  Include your loved one in activities. Invite him or her to go for walks and outings.  Help your loved one follow his or her treatment plan as directed by health care providers. This could mean driving him or her to therapy sessions or suggesting ways to cope with stress.  Remember that your support really matters. Social support is a huge benefit for someone who is coping with anxiety. Where to find more information A health care provider may be able to recommend mental health resources that are available online  or over the phone. You could start with:  Government sites such as the Substance Abuse and [place] (SAMHSA): ktimeonline.com  National mental health organizations such as the [employer] on [place]): www.nami.org Get help right away if:  Your loved one expresses thoughts about harming himself or herself or others.  Your loved one's behavior becomes hard to predict (erratic).  Your loved one shows behavior that does not make sense with the current time, place, or circumstances. These behaviors may include seeing, feeling, tasting, or hearing things that are not real (hallucinations) or having flashbacks. If you ever feel like your loved one may hurt himself or herself or others, or may have thoughts about taking his or her own life, get help right away. You can go to your nearest emergency department or call:  Your local emergency services (911 in the [place]  A suicide crisis helpline, such as the [place] at (506)109-0639. This is open 24 hours a day. Summary  People with anxiety disorders experience  overwhelming feelings of nervousness or worry. Friends and family can help by offering support and understanding.  Anxiety disorders are generally very treatable by mental health providers. They can be treated with psychotherapy (also known as talk therapy), behavior therapy, medicine, and mind-body programs.  Be compassionate and listen to your loved one. Be available if your friend or family member wants to talk, but give your loved one space if he or she does not feel like talking.  Find ways to care for your own body, mind, and well-being while supporting someone with anxiety. Try to maintain your normal routines and make time to do things that you enjoy. This information is not intended to replace advice given to you by your health care provider. Make sure you discuss any questions you have with your health care provider. Document Revised: 05/16/2018 Document Reviewed: 06/06/2016 Elsevier Patient Education  [place]

## 2019-09-05 ENCOUNTER — Telehealth: Payer: Self-pay | Admitting: Nurse Practitioner

## 2019-09-05 DIAGNOSIS — F332 Major depressive disorder, recurrent severe without psychotic features: Secondary | ICD-10-CM | POA: Diagnosis not present

## 2019-09-05 NOTE — Telephone Encounter (Signed)
We are unable to accept any pt's that are actively abusing drugs and/or alcohol per Dr. Vickey Huger. gna/sf

## 2019-09-06 DIAGNOSIS — R030 Elevated blood-pressure reading, without diagnosis of hypertension: Secondary | ICD-10-CM | POA: Insufficient documentation

## 2019-09-06 NOTE — Assessment & Plan Note (Signed)
Please purchase a blood pressure cuff and people like OMRON brand and it can be found at [facility].  Please check his heart rate and blood pressure at home and write those down.   BP Readings from Last 3 Encounters:  09/02/19 (!) 150/84  08/26/19 120/82  01/23/17 137/77

## 2019-09-06 NOTE — Assessment & Plan Note (Signed)
He reports headaches a couple times a month, usually associated with long jacks without sleep, could also be alcohol and meth or cocaine related.  He treats with Tylenol or Advil and sleep and resolves.  He has had no seizures.

## 2019-09-06 NOTE — Assessment & Plan Note (Signed)
Supplemented vitamin D with 400 IU daily recommended.

## 2019-09-06 NOTE — Assessment & Plan Note (Addendum)
He has sleep disturbance that could be associated with substance abuse.  He also has components of narcolepsy.

## 2019-09-06 NOTE — Assessment & Plan Note (Signed)
Continue to work on healthy lifestyle for prediabetes.

## 2019-09-06 NOTE — Assessment & Plan Note (Addendum)
A cardiology referral was placed at last visit. We discussed the fact that his drug abuse is likely a main cause of his syncope symptoms 2 mos ago.  Patient admits that he was probably high when this occurred. He has had no recurrence.

## 2019-09-10 ENCOUNTER — Telehealth: Payer: Self-pay | Admitting: Nurse Practitioner

## 2019-09-10 NOTE — Telephone Encounter (Signed)
We will only be able to see patient for the headache patient must be given this information   Ok to sch with Everlena Cooper   msg from Neurology.

## 2019-09-10 NOTE — Telephone Encounter (Signed)
Please notify the patient the Neuro can see for HA only; Not narcolepsy.

## 2019-09-15 ENCOUNTER — Telehealth: Payer: Self-pay | Admitting: Nurse Practitioner

## 2019-09-15 NOTE — Telephone Encounter (Signed)
Rejection Reason - Patient did not respond" [facility] said 6 days ago

## 2019-09-16 NOTE — Telephone Encounter (Signed)
Tried to call patient but no answer

## 2019-09-16 NOTE — Telephone Encounter (Signed)
no answerDesigner, [facility] - Neurology said 5 days ago  "we can only see for Headache and we made tried calling patient but got no answer or no VM will try back " [facility] said 6 days ago  "no answer unable to reach patient thank you for the referral " [facility] said about 4 hours ago  "Rejection Reason - Patient did not respond" [facility] said about 4 hours ago

## 2019-09-18 ENCOUNTER — Telehealth: Payer: Self-pay | Admitting: Nurse Practitioner

## 2019-09-18 NOTE — Telephone Encounter (Signed)
He comes in with his daughter, [name]. She is to be called with appt referrals: [phone].   Please call her with referral contact for [facility]

## 2019-09-18 NOTE — Telephone Encounter (Addendum)
I received a request for medical leave of absence. What is going on? He was working and going to the gym last month when I saw him. Neurology has been trying to reach him for appt. Did he connect with [facility] for substance abuse? Is he getting into an in-patient rehab program?   Plan: Please arrange a video visit with him. Please call his daughter, [name] as she schedules everything for him. [phone].   Thank you.

## 2019-09-19 NOTE — Telephone Encounter (Signed)
Tried to call but phone just rings and rings

## 2019-09-19 NOTE — Telephone Encounter (Signed)
He has in appt with you in oct do you want that sooner? I know ive called him twice and have not heard back from the previous TE message.

## 2019-09-19 NOTE — Telephone Encounter (Signed)
Tried to call but phone just rings and rings  

## 2019-09-19 NOTE — Telephone Encounter (Signed)
I need to address his request for work leave by 09/28/19. So, he needs to have a video or telephone appt ASAP.

## 2019-09-24 NOTE — Telephone Encounter (Signed)
Tried to call number we have on file but phone just rings and rings with no VM. Called number that Selena Batten gave for daughter and no answer and VM is full.

## 2019-09-24 NOTE — Telephone Encounter (Signed)
Tried to call number we have on file but phone just rings and rings with no VM. Called number that Selena Batten gave for daughter and no answer and VM is full. Will send letter to patient to call the office.

## 2019-09-24 NOTE — Telephone Encounter (Signed)
Sent patient a letter via mail to call our office back to discuss

## 2019-09-30 NOTE — Telephone Encounter (Signed)
Ok. Thank you! I have notified neurology via message with pt daughter info.

## 2019-12-03 ENCOUNTER — Ambulatory Visit: Payer: BC Managed Care – PPO | Admitting: Nurse Practitioner

## 2020-08-01 ENCOUNTER — Emergency Department
Admission: EM | Admit: 2020-08-01 | Discharge: 2020-08-01 | Disposition: A | Discharge status: Home / Self Care | Payer: Self-pay | Attending: Emergency Medicine | Admitting: Emergency Medicine

## 2020-08-01 ENCOUNTER — Emergency Department: Payer: Self-pay

## 2020-08-01 ENCOUNTER — Other Ambulatory Visit: Payer: Self-pay

## 2020-08-01 DIAGNOSIS — M5441 Lumbago with sciatica, right side: Secondary | ICD-10-CM | POA: Insufficient documentation

## 2020-08-01 DIAGNOSIS — F17210 Nicotine dependence, cigarettes, uncomplicated: Secondary | ICD-10-CM | POA: Insufficient documentation

## 2020-08-01 DIAGNOSIS — Y990 Civilian activity done for income or pay: Secondary | ICD-10-CM | POA: Insufficient documentation

## 2020-08-01 DIAGNOSIS — I10 Essential (primary) hypertension: Secondary | ICD-10-CM | POA: Insufficient documentation

## 2020-08-01 LAB — URINALYSIS, COMPLETE (UACMP) WITH MICROSCOPIC
Bacteria, UA: NONE SEEN
Bilirubin Urine: NEGATIVE
Glucose, UA: NEGATIVE mg/dL
Hgb urine dipstick: NEGATIVE
Ketones, ur: NEGATIVE mg/dL
Leukocytes,Ua: NEGATIVE
Nitrite: NEGATIVE
Protein, ur: 30 mg/dL — AB
Specific Gravity, Urine: 1.03 (ref 1.005–1.030)
Squamous Epithelial / LPF: NONE SEEN (ref 0–5)
pH: 5 (ref 5.0–8.0)

## 2020-08-01 MED ORDER — METHOCARBAMOL 500 MG PO TABS: 500.0000 mg | ORAL_TABLET | Freq: Four times a day (QID) | ORAL | 0 refills | Status: DC

## 2020-08-01 MED ORDER — HYDROCODONE-ACETAMINOPHEN 5-325 MG PO TABS: 1.0000 | ORAL_TABLET | Freq: Four times a day (QID) | ORAL | 0 refills | Status: DC | PRN

## 2020-08-01 MED ORDER — OXYCODONE-ACETAMINOPHEN 7.5-325 MG PO TABS
1.0000 | ORAL_TABLET | Freq: Once | ORAL | Status: AC
  Administered 2020-08-01: 1 via ORAL
  Filled 2020-08-01: qty 1

## 2020-08-01 MED ORDER — METHOCARBAMOL 500 MG PO TABS
1000.0000 mg | ORAL_TABLET | Freq: Once | ORAL | Status: AC
  Administered 2020-08-01: 1000 mg via ORAL
  Filled 2020-08-01: qty 2

## 2020-08-01 MED ORDER — KETOROLAC TROMETHAMINE 30 MG/ML IJ SOLN
30.0000 mg | Freq: Once | INTRAMUSCULAR | Status: AC
Start: 2020-08-01 — End: 2020-08-01
  Administered 2020-08-01: 30 mg via INTRAMUSCULAR
  Filled 2020-08-01: qty 1

## 2020-08-01 MED ORDER — PREDNISONE 10 MG PO TABS: ORAL_TABLET | ORAL | 0 refills | Status: DC

## 2020-08-01 NOTE — Discharge Instructions (Addendum)
Follow-up with Dr. Deeann Saint who is on-call for orthopedics if you continue to have problems with your back or not improving.  3 prescriptions were sent to the pharmacy.  1 was prednisone which she will take for the next 6 days, Robaxin as needed for muscle spasms and hydrocodone only if needed for pain.  You may use ice or heat to your back as needed for discomfort.  Not drive or operate machinery while taking the muscle relaxant and pain medication as it could cause drowsiness and increase your risk for injury.

## 2020-08-01 NOTE — ED Notes (Signed)
See triage note  Presents via EMS from home with lower back pain  States pain started at work couple of days ago  Worse this am

## 2020-08-01 NOTE — ED Provider Notes (Signed)
[facility] Emergency Department Provider Note  ____________________________________________   Event Date/Time   First MD Initiated Contact with Patient 08/01/20 1109     (approximate)  I have reviewed the triage vital signs and the nursing notes.   HISTORY  Chief Complaint Back Pain   HPI Andrew Parsons is a 40 y.o. male presents to the ED via EMS from home with complaint of low back pain.  Patient states that he began hurting a few days ago at work but is unaware of any known injury.  Patient states that 20 years ago he had a history of "slipped disc and sciatica".  He denies any history of kidney stones or UTIs.  Patient has not taken any over-the-counter medication for his pain.  Patient reports that there is increased pain with standing and ambulation.  He denies any incontinence of bowel or bladder.  Rates pain as 9 out of 10.         Past Medical History:  Diagnosis Date   Alcohol abuse    Anxiety 08/26/2019   Atypical syncope 08/26/2019   Depression    Fainting spell    Fatigue 08/26/2019   Hyperlipidemia    Hypertension    Polysubstance (excluding opioids) dependence (HCC) 08/26/2019   Sleep disturbance 08/26/2019    Patient Active Problem List   Diagnosis Date Noted   Elevated BP without diagnosis of hypertension 09/06/2019   Pre-diabetes 09/02/2019   Low vitamin D level 09/02/2019   Chronic nonintractable headache 09/02/2019   Polysubstance (excluding opioids) dependence (HCC) 08/26/2019   Sleep disturbance 08/26/2019   Anxiety 08/26/2019   Atypical syncope 08/26/2019   Fatigue 08/26/2019    Past Surgical History:  Procedure Laterality Date   TYMPANOPLASTY      Prior to Admission medications   Medication Sig Start Date End Date Taking? Authorizing Provider  HYDROcodone-acetaminophen (NORCO/VICODIN) 5-325 MG tablet Take 1 tablet by mouth every 6 (six) hours as needed. 08/01/20 08/01/21 Yes Kelwin Gibler L, PA-C  methocarbamol  (ROBAXIN) 500 MG tablet Take 1 tablet (500 mg total) by mouth 4 (four) times daily. 08/01/20  Yes Tommi Rumps, PA-C  predniSONE (DELTASONE) 10 MG tablet Take 6 tablets  today, on day 2 take 5 tablets, day 3 take 4 tablets, day 4 take 3 tablets, day 5 take  2 tablets and 1 tablet the last day 08/01/20  Yes Tommi Rumps, PA-C    Allergies Patient has no known allergies.  Family History  Problem Relation Age of Onset   Early death Mother    Stroke Mother    Depression Daughter    Hyperlipidemia Daughter     Social History Social History   Tobacco Use   Smoking status: Every Day    Packs/day: 0.50    Pack years: 0.00    Types: Cigarettes   Smokeless tobacco: Never  Vaping Use   Vaping Use: Never used  Substance Use Topics   Alcohol use: Yes    Comment: not now but has abused it the past and episodic binge    Drug use: Not Currently    Types: Methamphetamines, Cocaine, Marijuana    Comment: Substance use since 40 yo     Review of Systems Constitutional: No fever/chills Eyes: No visual changes. Cardiovascular: Denies chest pain. Respiratory: Denies shortness of breath. Gastrointestinal: No abdominal pain.  No nausea, no vomiting.   Genitourinary: Negative for dysuria. Musculoskeletal: Positive for low back pain.  Positive right leg radiculopathy. Skin: Negative for  rash. Neurological: Negative for headaches, focal weakness or numbness.  ____________________________________________   PHYSICAL EXAM:  VITAL SIGNS: ED Triage Vitals [08/01/20 1035]  Enc Vitals Group     BP 136/82     Pulse Rate 85     Resp 18     Temp 97.8 F (36.6 C)     Temp Source Oral     SpO2 97 %     Weight 214 lb (97.1 kg)     Height 5\' 10"  (1.778 m)     Head Circumference      Peak Flow      Pain Score 9     Pain Loc      Pain Edu?      Excl. in GC?     Constitutional: Alert and oriented. Well appearing and in no acute distress. Eyes: Conjunctivae are normal.  Head:  Atraumatic. Neck: No stridor.  Cervical tenderness is noted on palpation posteriorly.  Range of motion is without restriction. Cardiovascular: Normal rate, regular rhythm. Grossly normal heart sounds.  Good peripheral circulation. Respiratory: Normal respiratory effort.  No retractions. Lungs CTAB. Gastrointestinal: Soft and nontender. No distention.  Bowel sounds normoactive x4 quadrants. Musculoskeletal: No tenderness is noted on palpation of the thoracic spine however patient is moderately tender at L5-S1 area and right paravertebral muscles.  Range of motion is slow and guarded secondary to discomfort.  Straight leg raises were negative.  Good muscle strength bilaterally.  Patient was able to get from the recliner in the room to the stretcher without any assistance. Neurologic:  Normal speech and language.  Reflexes were 2+ bilaterally.  No gross focal neurologic deficits are appreciated.  Skin:  Skin is warm, dry and intact. No rash noted. Psychiatric: Mood and affect are normal. Speech and behavior are normal.  ____________________________________________   LABS (all labs ordered are listed, but only abnormal results are displayed)  Labs Reviewed  URINALYSIS, COMPLETE (UACMP) WITH MICROSCOPIC - Abnormal; Notable for the following components:      Result Value   Color, Urine YELLOW (*)    APPearance HAZY (*)    Protein, ur 30 (*)    All other components within normal limits   ____________________________________________  EKG  Deferred ____________________________________________  RADIOLOGY , personally viewed and evaluated these images (plain radiographs) as part of my medical decision making, as well as reviewing the written report by the radiologist.   Official radiology report(s): DG Lumbar Spine 2-3 Views  Result Date: 08/01/2020 CLINICAL DATA:  Worsening low back pain for 1 day.  Old injury. EXAM: LUMBAR SPINE - 2-3 VIEW COMPARISON:  03/14/2011.  MRI,  07/28/2015. FINDINGS: No fracture, bone lesion or spondylolisthesis. Straightened lumbar lordosis. Mild loss of disc height at L5-S1. Remaining lumbar discs are well preserved. Soft tissues are unremarkable. IMPRESSION: 1. No fracture or acute finding.  No spondylolisthesis. 2. Mild disc degenerative changes at L5-S1, stable from the prior exam. Electronically Signed   By: 07/30/2015 M.D.   On: 08/01/2020 13:41    ____________________________________________   PROCEDURES  Procedure(s) performed (including Critical Care):  Procedures   ____________________________________________   INITIAL IMPRESSION / ASSESSMENT AND PLAN / ED COURSE  As part of my medical decision making, I reviewed the following data within the electronic MEDICAL RECORD NUMBER Notes from prior ED visits and [place] Controlled Substance Database  40 year old male presents to the ED via EMS with complaint of low back pain without history of recent injury.  Was given Toradol,  methocarbamol 1000 mg p.o. and Percocet 7.5 mg while in the ED.  X-rays were negative for any acute bony changes.  Patient was reassured at the time of his discharge had improved.  We discussed symptoms of sciatica and his physical findings.  Patient was discharged with a prescription for a tapering dose of prednisone over the next 6 days, continued methocarbamol and hydrocodone as needed every 6 hours for pain.  Patient is encouraged to use ice or heat to his back as needed for discomfort and follow-up with orthopedics if not improving or worsening of his symptoms.   ____________________________________________   FINAL CLINICAL IMPRESSION(S) / ED DIAGNOSES  Final diagnoses:  Acute right-sided low back pain with right-sided sciatica     ED Discharge Orders          Ordered    predniSONE (DELTASONE) 10 MG tablet        08/01/20 1443    methocarbamol (ROBAXIN) 500 MG tablet  4 times daily        08/01/20 1443    HYDROcodone-acetaminophen  (NORCO/VICODIN) 5-325 MG tablet  Every 6 hours PRN        08/01/20 1443             Note:  This document was prepared using Dragon voice recognition software and may include unintentional dictation errors.    Tommi Rumps, PA-C 08/01/20 1630    Sharyn Creamer, MD 08/02/20 2144

## 2020-08-01 NOTE — ED Triage Notes (Signed)
Pt in via EMS from home with c/o back pain. Pt reports started hurting after work a few days ago and the next day he had a ha and back pain. Pt with hx of slipped disc and sciatica. Pt reports the pain shoots down right leg

## 2020-08-05 ENCOUNTER — Emergency Department
Admission: EM | Admit: 2020-08-05 | Discharge: 2020-08-06 | Disposition: A | Discharge status: Home / Self Care | Payer: Self-pay | Attending: Emergency Medicine | Admitting: Emergency Medicine

## 2020-08-05 ENCOUNTER — Encounter: Payer: Self-pay | Admitting: Emergency Medicine

## 2020-08-05 ENCOUNTER — Other Ambulatory Visit: Payer: Self-pay

## 2020-08-05 DIAGNOSIS — F15950 Other stimulant use, unspecified with stimulant-induced psychotic disorder with delusions: Secondary | ICD-10-CM

## 2020-08-05 DIAGNOSIS — I10 Essential (primary) hypertension: Secondary | ICD-10-CM | POA: Insufficient documentation

## 2020-08-05 DIAGNOSIS — Z20822 Contact with and (suspected) exposure to covid-19: Secondary | ICD-10-CM | POA: Insufficient documentation

## 2020-08-05 DIAGNOSIS — U071 COVID-19: Secondary | ICD-10-CM

## 2020-08-05 DIAGNOSIS — F17210 Nicotine dependence, cigarettes, uncomplicated: Secondary | ICD-10-CM | POA: Insufficient documentation

## 2020-08-05 DIAGNOSIS — R45851 Suicidal ideations: Secondary | ICD-10-CM | POA: Insufficient documentation

## 2020-08-05 DIAGNOSIS — Z8616 Personal history of COVID-19: Secondary | ICD-10-CM | POA: Insufficient documentation

## 2020-08-05 LAB — COMPREHENSIVE METABOLIC PANEL
ALT: 46 U/L — ABNORMAL HIGH (ref 0–44)
AST: 33 U/L (ref 15–41)
Albumin: 4.1 g/dL (ref 3.5–5.0)
Alkaline Phosphatase: 58 U/L (ref 38–126)
Anion gap: 8 (ref 5–15)
BUN: 19 mg/dL (ref 6–20)
CO2: 25 mmol/L (ref 22–32)
Calcium: 8.6 mg/dL — ABNORMAL LOW (ref 8.9–10.3)
Chloride: 105 mmol/L (ref 98–111)
Creatinine, Ser: 1.1 mg/dL (ref 0.61–1.24)
GFR, Estimated: >60 mL/min (ref >60)
Glucose, Bld: 158 mg/dL — ABNORMAL HIGH (ref 70–99)
Potassium: 3.5 mmol/L (ref 3.5–5.1)
Sodium: 138 mmol/L (ref 135–145)
Total Bilirubin: 0.8 mg/dL (ref 0.3–1.2)
Total Protein: 6.9 g/dL (ref 6.5–8.1)

## 2020-08-05 LAB — SALICYLATE LEVEL: Salicylate Lvl: <7 mg/dL — ABNORMAL LOW (ref 7.0–30.0)

## 2020-08-05 LAB — URINE DRUG SCREEN, QUALITATIVE (ARMC ONLY)
Amphetamines, Ur Screen: POSITIVE — AB
Barbiturates, Ur Screen: NOT DETECTED
Benzodiazepine, Ur Scrn: NOT DETECTED
Cannabinoid 50 Ng, Ur ~~LOC~~: NOT DETECTED
Cocaine Metabolite,Ur ~~LOC~~: NOT DETECTED
MDMA (Ecstasy)Ur Screen: NOT DETECTED
Methadone Scn, Ur: NOT DETECTED
Opiate, Ur Screen: NOT DETECTED
Phencyclidine (PCP) Ur S: NOT DETECTED
Tricyclic, Ur Screen: NOT DETECTED

## 2020-08-05 LAB — ETHANOL: Alcohol, Ethyl (B): <10 mg/dL (ref <10)

## 2020-08-05 LAB — CBC
HCT: 44 % (ref 39.0–52.0)
Hemoglobin: 15.7 g/dL (ref 13.0–17.0)
MCH: 30.7 pg (ref 26.0–34.0)
MCHC: 35.7 g/dL (ref 30.0–36.0)
MCV: 85.9 fL (ref 80.0–100.0)
Platelets: 204 10*3/uL (ref 150–400)
RBC: 5.12 MIL/uL (ref 4.22–5.81)
RDW: 12 % (ref 11.5–15.5)
WBC: 7.8 10*3/uL (ref 4.0–10.5)
nRBC: 0 % (ref 0.0–0.2)

## 2020-08-05 LAB — RESP PANEL BY RT-PCR (FLU A&B, COVID) ARPGX2
Influenza A by PCR: NEGATIVE
Influenza B by PCR: NEGATIVE
SARS Coronavirus 2 by RT PCR: POSITIVE — AB

## 2020-08-05 LAB — ACETAMINOPHEN LEVEL: Acetaminophen (Tylenol), Serum: <10 ug/mL — ABNORMAL LOW (ref 10–30)

## 2020-08-05 MED ORDER — RISPERIDONE 0.5 MG PO TBDP
1.0000 mg | ORAL_TABLET | Freq: Two times a day (BID) | ORAL | Status: DC
  Administered 2020-08-05 – 2020-08-06 (×2): 1 mg via ORAL
  Filled 2020-08-05 (×2): qty 2

## 2020-08-05 NOTE — ED Notes (Signed)
Pt given dinner  

## 2020-08-05 NOTE — Consult Note (Signed)
[facility] Face-to-Face Psychiatry Consult   Reason for Consult: Consult to 40 year old man with a history of substance abuse presenting with paranoia and psychotic symptoms Referring Physician: Fuller Plan Patient Identification: Andrew Parsons MRN:  [id number] Principal Diagnosis: Amphetamine and psychostimulant-induced psychosis with delusions (HCC) Diagnosis:  Principal Problem:   Amphetamine and psychostimulant-induced psychosis with delusions (HCC) Active Problems:   COVID   Total Time spent with patient: 1 hour  Subjective:   Andrew Parsons is a 40 y.o. male patient admitted with "I had thoughts about harming myself".  HPI: 40 year old man presents to the emergency room.  He says today he wrote a letter threatening to harm himself if he did not get treatment.  He has been having paranoid thoughts afraid that people are pursuing him or are out to get him.  Specifically he has a paranoid belief that there is someone who is fooling around with his girlfriend.  Sometimes he sees this person's name written in printed material that he sees.  Cannot stop thinking about it.  This has been going on on and off for about 20 years but has gotten worse in the last week or so.  Mood stays anxious and depressed.  Having suicidal thoughts about jumping off a bridge.  No homicidal thoughts.  Patient is enrolled to start treatment at [facility] but is not seeing anyone for treatment yet and not on any medication.  Patient admits to using crystal meth every day for the past 20 years and says he used some today.  Denies other drug use.  Patient is COVID-positive on screening test in the emergency room  Past Psychiatric History: Past history of presentation with substance abuse of amphetamines.  Sounds like he has never really engaged in any active substance abuse treatment.  Has had psychotic like symptoms intermittently not clear if he is ever been on medicine.  No past suicide attempts  Risk to Self:   Risk to Others:   Prior  Inpatient Therapy:   Prior Outpatient Therapy:    Past Medical History:  Past Medical History:  Diagnosis Date   Alcohol abuse    Anxiety 08/26/2019   Atypical syncope 08/26/2019   Depression    Fainting spell    Fatigue 08/26/2019   Hyperlipidemia    Hypertension    Polysubstance (excluding opioids) dependence (HCC) 08/26/2019   Sleep disturbance 08/26/2019    Past Surgical History:  Procedure Laterality Date   TYMPANOPLASTY     Family History:  Family History  Problem Relation Age of Onset   Early death Mother    Stroke Mother    Depression Daughter    Hyperlipidemia Daughter    Family Psychiatric  History: See previous Social History:  Social History   Substance and Sexual Activity  Alcohol Use Yes   Comment: not now but has abused it the past and episodic binge      Social History   Substance and Sexual Activity  Drug Use Not Currently   Types: Methamphetamines, Cocaine, Marijuana   Comment: Substance use since 40 yo     Social History   Socioeconomic History   Marital status: Single    Spouse name: Not on file   Number of children: Not on file   Years of education: Not on file   Highest education level: GED or equivalent  Occupational History   Occupation: Administrator   Tobacco Use   Smoking status: Every Day    Packs/day: 0.50    Pack years: 0.00  Types: Cigarettes   Smokeless tobacco: Never  Vaping Use   Vaping Use: Never used  Substance and Sexual Activity   Alcohol use: Yes    Comment: not now but has abused it the past and episodic binge    Drug use: Not Currently    Types: Methamphetamines, Cocaine, Marijuana    Comment: Substance use since 40 yo    Sexual activity: Yes  Other Topics Concern   Not on file  Social History Narrative   Lives with a friend . He has 2 children and grandkids .    Social Determinants of Health   Financial Resource Strain: Not on file  Food Insecurity: Not on file  Transportation Needs: Not on file   Physical Activity: Not on file  Stress: Not on file  Social Connections: Not on file   Additional Social History:    Allergies:  No Known Allergies  Labs:  Results for orders placed or performed during the hospital encounter of 08/05/20 (from the past 48 hour(s))  Comprehensive metabolic panel     Status: Abnormal   Collection Time: 08/05/20 12:31 PM  Result Value Ref Range   Sodium 138 135 - 145 mmol/L   Potassium 3.5 3.5 - 5.1 mmol/L   Chloride 105 98 - 111 mmol/L   CO2 25 22 - 32 mmol/L   Glucose, Bld 158 (H) 70 - 99 mg/dL    Comment: Glucose reference range applies only to samples taken after fasting for at least 8 hours.   BUN 19 6 - 20 mg/dL   Creatinine, Ser 1.61 0.61 - 1.24 mg/dL   Calcium 8.6 (L) 8.9 - 10.3 mg/dL   Total Protein 6.9 6.5 - 8.1 g/dL   Albumin 4.1 3.5 - 5.0 g/dL   AST 33 15 - 41 U/L   ALT 46 (H) 0 - 44 U/L   Alkaline Phosphatase 58 38 - 126 U/L   Total Bilirubin 0.8 0.3 - 1.2 mg/dL   GFR, Estimated >09 >60 mL/min    Comment: (NOTE) Calculated using the CKD-EPI Creatinine Equation (2021)    Anion gap 8 5 - 15    Comment: Performed at [facility], [address] [place], [place] [address]  Ethanol     Status: None   Collection Time: 08/05/20 12:31 PM  Result Value Ref Range   Alcohol, Ethyl (B) <10 <10 mg/dL    Comment: (NOTE) Lowest detectable limit for serum alcohol is 10 mg/dL.  For medical purposes only. Performed at [facility], [address] [place], [place] [address]   Salicylate level     Status: Abnormal   Collection Time: 08/05/20 12:31 PM  Result Value Ref Range   Salicylate Lvl <7.0 (L) 7.0 - 30.0 mg/dL    Comment: Performed at [facility], [address] [place], [place] [address]  Acetaminophen level     Status: Abnormal   Collection Time: 08/05/20 12:31 PM  Result Value Ref Range   Acetaminophen (Tylenol), Serum <10 (L) 10 - 30 ug/mL    Comment: (NOTE) Therapeutic concentrations vary  significantly. A range of 10-30 ug/mL  may be an effective concentration for many patients. However, some  are best treated at concentrations outside of this range. Acetaminophen concentrations >150 ug/mL at 4 hours after ingestion  and >50 ug/mL at 12 hours after ingestion are often associated with  toxic reactions.  Performed at [facility], [address] [place], [place] [address]   cbc  Status: None   Collection Time: 08/05/20 12:31 PM  Result Value Ref Range   WBC 7.8 4.0 - 10.5 K/uL   RBC 5.12 4.22 - 5.81 MIL/uL   Hemoglobin 15.7 13.0 - 17.0 g/dL   HCT 61.6 07.3 - 71.0 %   MCV 85.9 80.0 - 100.0 fL   MCH 30.7 26.0 - 34.0 pg   MCHC 35.7 30.0 - 36.0 g/dL   RDW 62.6 94.8 - 54.6 %   Platelets 204 150 - 400 K/uL   nRBC 0.0 0.0 - 0.2 %    Comment: Performed at [facility], [address] [place], [place] [address]  Resp Panel by RT-PCR (Flu A&B, Covid) Nasopharyngeal Swab     Status: Abnormal   Collection Time: 08/05/20  1:09 PM   Specimen: Nasopharyngeal Swab; Nasopharyngeal(NP) swabs in vial transport medium  Result Value Ref Range   SARS Coronavirus 2 by RT PCR POSITIVE (A) NEGATIVE    Comment: RESULT CALLED TO, READ BACK BY AND VERIFIED WITH: ALLYSON YOW 08/05/20 1413 KLW (NOTE) SARS-CoV-2 target nucleic acids are DETECTED.  The SARS-CoV-2 RNA is generally detectable in upper respiratory specimens during the acute phase of infection. Positive results are indicative of the presence of the identified virus, but do not rule out bacterial infection or co-infection with other pathogens not detected by the test. Clinical correlation with patient history and other diagnostic information is necessary to determine patient infection status. The expected result is Negative.  Fact Sheet for Patients: BloggerCourse.com  Fact Sheet for Healthcare Providers: SeriousBroker.it  This test is not yet approved  or cleared by the [place] FDA and  has been authorized for detection and/or diagnosis of SARS-CoV-2 by FDA under an Emergency Use Authorization (EUA).  This EUA will remain in effect (meaning this test can be used ) for the duration of  the COVID-19 declaration under Section 564(b)(1) of the Act, 21 U.S.C. section 360bbb-3(b)(1), unless the authorization is terminated or revoked sooner.     Influenza A by PCR NEGATIVE NEGATIVE   Influenza B by PCR NEGATIVE NEGATIVE    Comment: (NOTE) The Xpert Xpress SARS-CoV-2/FLU/RSV plus assay is intended as an aid in the diagnosis of influenza from Nasopharyngeal swab specimens and should not be used as a sole basis for treatment. Nasal washings and aspirates are unacceptable for Xpert Xpress SARS-CoV-2/FLU/RSV testing.  Fact Sheet for Patients: BloggerCourse.com  Fact Sheet for Healthcare Providers: SeriousBroker.it  This test is not yet approved or cleared by the [place] FDA and has been authorized for detection and/or diagnosis of SARS-CoV-2 by FDA under an Emergency Use Authorization (EUA). This EUA will remain in effect (meaning this test can be used) for the duration of the COVID-19 declaration under Section 564(b)(1) of the Act, 21 U.S.C. section 360bbb-3(b)(1), unless the authorization is terminated or revoked.  Performed at [facility], [address] [place], [place] [address]   Urine Drug Screen, Qualitative     Status: Abnormal   Collection Time: 08/05/20  2:20 PM  Result Value Ref Range   Tricyclic, Ur Screen NONE DETECTED NONE DETECTED   Amphetamines, Ur Screen POSITIVE (A) NONE DETECTED   MDMA (Ecstasy)Ur Screen NONE DETECTED NONE DETECTED   Cocaine Metabolite,Ur [place] NONE DETECTED NONE DETECTED   Opiate, Ur Screen NONE DETECTED NONE DETECTED   Phencyclidine (PCP) Ur S NONE DETECTED NONE DETECTED   Cannabinoid 50 Ng, Ur [place] NONE DETECTED NONE DETECTED    Barbiturates, Ur Screen NONE DETECTED NONE DETECTED  Benzodiazepine, Ur Scrn NONE DETECTED NONE DETECTED   Methadone Scn, Ur NONE DETECTED NONE DETECTED    Comment: (NOTE) Tricyclics + metabolites, urine    Cutoff 1000 ng/mL Amphetamines + metabolites, urine  Cutoff 1000 ng/mL MDMA (Ecstasy), urine              Cutoff 500 ng/mL Cocaine Metabolite, urine          Cutoff 300 ng/mL Opiate + metabolites, urine        Cutoff 300 ng/mL Phencyclidine (PCP), urine         Cutoff 25 ng/mL Cannabinoid, urine                 Cutoff 50 ng/mL Barbiturates + metabolites, urine  Cutoff 200 ng/mL Benzodiazepine, urine              Cutoff 200 ng/mL Methadone, urine                   Cutoff 300 ng/mL  The urine drug screen provides only a preliminary, unconfirmed analytical test result and should not be used for non-medical purposes. Clinical consideration and professional judgment should be applied to any positive drug screen result due to possible interfering substances. A more specific alternate chemical method must be used in order to obtain a confirmed analytical result. Gas chromatography / mass spectrometry (GC/MS) is the preferred confirm atory method. Performed at [facility], [address] [place], [place] [address]     No current facility-administered medications for this encounter.   Current Outpatient Medications  Medication Sig Dispense Refill   HYDROcodone-acetaminophen (NORCO/VICODIN) 5-325 MG tablet Take 1 tablet by mouth every 6 (six) hours as needed. 12 tablet 0   methocarbamol (ROBAXIN) 500 MG tablet Take 1 tablet (500 mg total) by mouth 4 (four) times daily. 12 tablet 0   predniSONE (DELTASONE) 10 MG tablet Take 6 tablets  today, on day 2 take 5 tablets, day 3 take 4 tablets, day 4 take 3 tablets, day 5 take  2 tablets and 1 tablet the last day 21 tablet 0    Musculoskeletal: Strength & Muscle Tone: within normal limits Gait & Station: normal Patient leans:  N/A            Psychiatric Specialty Exam:  Presentation  General Appearance:  No data recorded Eye Contact: No data recorded Speech: No data recorded Speech Volume: No data recorded Handedness: No data recorded  Mood and Affect  Mood: No data recorded Affect: No data recorded  Thought Process  Thought Processes: No data recorded Descriptions of Associations:No data recorded Orientation:No data recorded Thought Content:No data recorded History of Schizophrenia/Schizoaffective disorder:No  Duration of Psychotic Symptoms:No data recorded Hallucinations:No data recorded Ideas of Reference:No data recorded Suicidal Thoughts:No data recorded Homicidal Thoughts:No data recorded  Sensorium  Memory: No data recorded Judgment: No data recorded Insight: No data recorded  Executive Functions  Concentration: No data recorded Attention Span: No data recorded Recall: No data recorded Fund of Knowledge: No data recorded Language: No data recorded  Psychomotor Activity  Psychomotor Activity: No data recorded  Assets  Assets: No data recorded  Sleep  Sleep: No data recorded  Physical Exam: Physical Exam Vitals and nursing note reviewed.  Constitutional:      Appearance: Normal appearance.  HENT:     Head: Normocephalic and atraumatic.     Mouth/Throat:     Pharynx: Oropharynx is clear.  Eyes:     Pupils: Pupils are equal, round, and  reactive to light.  Cardiovascular:     Rate and Rhythm: Normal rate and regular rhythm.  Pulmonary:     Effort: Pulmonary effort is normal.     Breath sounds: Normal breath sounds.  Abdominal:     General: Abdomen is flat.     Palpations: Abdomen is soft.  Musculoskeletal:        General: Normal range of motion.  Skin:    General: Skin is warm and dry.  Neurological:     General: No focal deficit present.     Mental Status: He is alert. Mental status is at baseline.  Psychiatric:        Attention and  Perception: He is inattentive.        Mood and Affect: Mood is anxious.        Speech: Speech is tangential.        Behavior: Behavior is agitated.        Thought Content: Thought content includes suicidal ideation.        Cognition and Memory: Cognition is impaired.        Judgment: Judgment is impulsive.   Review of Systems  Constitutional: Negative.   HENT: Negative.    Eyes: Negative.   Respiratory: Negative.    Cardiovascular: Negative.   Gastrointestinal: Negative.   Musculoskeletal: Negative.   Skin: Negative.   Neurological: Negative.   Psychiatric/Behavioral:  Positive for depression, substance abuse and suicidal ideas. The patient is nervous/anxious.   Blood pressure (!) 158/110, pulse (!) 111, temperature 98.1 F (36.7 C), temperature source Oral, resp. rate 18, height 5\' 10"  (1.778 m), weight 94.8 kg, SpO2 98 %. Body mass index is 29.99 kg/m.  Treatment Plan Summary: Plan 40 year old man presenting with psychotic symptoms including delusions and hallucinations with suicidal ideation.  Patient presents as somewhat confused and disorganized in his thoughts.  Possibly he may have an underlying psychotic disorder but the most likely explanation is his chronic use of methamphetamine.  Patient unfortunately is positive for COVID requiring a 10-day quarantine before he would be eligible for admission to psychiatric unit.  Orders will be placed to start Risperdal as treatment for psychotic symptoms.  Continue monitoring symptoms while he is in the emergency room.  Labs reviewed.  Disposition: Recommend psychiatric Inpatient admission when medically cleared. Supportive therapy provided about ongoing stressors.  Mordecai RasmussenJohn Spenser Cong, MD 08/05/2020 6:53 PM

## 2020-08-05 NOTE — ED Notes (Signed)
TTS at bedside. 

## 2020-08-05 NOTE — ED Notes (Signed)
Psychiatrist at bedside

## 2020-08-05 NOTE — ED Notes (Signed)
VOLUNTARY awaiting TTS/PSYCH consult 

## 2020-08-05 NOTE — ED Notes (Signed)
Pt has shoes purple and teal, one white sock and one black sock, white tank top, black shirt, blue shorts and blue under ware. One pare of silver earrings, yellow bracelet and black hat, cell phone. Marlboro cigarettes, 1 black lighter

## 2020-08-05 NOTE — ED Notes (Signed)
Pt also has Advertising account planner

## 2020-08-05 NOTE — ED Notes (Signed)
Dr. Roylene Reason pt have crayons and color book.

## 2020-08-05 NOTE — BH Assessment (Signed)
Comprehensive Clinical Assessment (CCA) Note  08/05/2020 Andrew Parsons [id number]  Andrew Parsons is a 40 year old male who presents to the ER due to having thoughts of ending his life. He further reports of having the thoughts three different times but this time, it seemed too real and felt as though he didn't have any other options or reasons to live, and it scared him. He also reports, he has dealt with depression throughout his life and currently his family doesn't allow him to live alone because of the fear of him harming his self. Yesterday (08/04/2020) He had an argument with his adult daughter and current girlfriend. Per his report, the two ladies do not get alone and he feels torn between the two. The officer who came to the house, brought him to the ER because he made statements that were concerning. However, when the officer left, the patient left the hospital and didn't check-in. He believes his daughter doesn't like the girlfriend because, "she's not her mom." He was married to the child's mother for approximately 15 years. Due to this mental and emotional state, patient loss his job and currently unemployed. He states he wasn't at his best and was unable to concentrate on his work.  During the interview, the patient was cooperative and pleasant. He was able to provide appropriate answers to the questions. He doesn't report of having any involvement with the legal system. He denies history and aggression. He denies HI and AV/H.  Chief Complaint:  Chief Complaint  Patient presents with   Suicidal   Visit Diagnosis: Major Depression    CCA Screening, Triage and Referral (STR)  Patient Reported Information How did you hear about [place]? Self  Referral name: No data recorded Referral phone number: No data recorded  Whom do you see for routine medical problems? No data recorded Practice/Facility Name: No data recorded Practice/Facility Phone Number: No data recorded Name of Contact: No data  recorded Contact Number: No data recorded Contact Fax Number: No data recorded Prescriber Name: No data recorded Prescriber Address (if known): No data recorded  What Is the Reason for Your Visit/Call Today? Self  How Long Has This Been Causing You Problems? > than 6 months  What Do You Feel Would Help You the Most Today? Treatment for Depression or other mood problem; Alcohol or Drug Use Treatment   Have You Recently Been in Any Inpatient Treatment (Hospital/Detox/Crisis Center/28-Day Program)? No data recorded Name/Location of Program/Hospital:No data recorded How Long Were You There? No data recorded When Were You Discharged? No data recorded  Have You Ever Received Services From [facility] Before? No data recorded Who Do You See at [facility]? No data recorded  Have You Recently Had Any Thoughts About Hurting Yourself? Yes  Are You Planning to Commit Suicide/Harm Yourself At This time? Yes   Have you Recently Had Thoughts About Hurting Someone Karolee Ohs? No  Explanation: No data recorded  Have You Used Any Alcohol or Drugs in the Past 24 Hours? Yes  How Long Ago Did You Use Drugs or Alcohol? No data recorded What Did You Use and How Much? CrystalMeth   Do You Currently Have a Therapist/Psychiatrist? Yes  Name of Therapist/Psychiatrist: Recently started to [facility]   Have You Been Recently Discharged From Any Office Practice or Programs? No  Explanation of Discharge From Practice/Program: No data recorded    CCA Screening Triage Referral Assessment Type of Contact: Face-to-Face  Is this Initial or Reassessment? No data recorded Date Telepsych consult ordered  in [facility]:  No data recorded Time Telepsych consult ordered in [facility]:  No data recorded  Patient Reported Information Reviewed? No data recorded Patient Left Without Being Seen? No data recorded Reason for Not Completing Assessment: No data recorded  Collateral Involvement: No data recorded  Does Patient Have a  Court Appointed Legal Guardian? No data recorded Name and Contact of Legal Guardian: No data recorded If Minor and Not Living with Parent(s), Who has Custody? No data recorded Is CPS involved or ever been involved? No data recorded Is APS involved or ever been involved? No data recorded  Patient Determined To Be At Risk for Harm To Self or Others Based on Review of Patient Reported Information or Presenting Complaint? No data recorded Method: No data recorded Availability of Means: No data recorded Intent: No data recorded Notification Required: No data recorded Additional Information for Danger to Others Potential: No data recorded Additional Comments for Danger to Others Potential: No data recorded Are There Guns or Other Weapons in Your Home? No data recorded Types of Guns/Weapons: No data recorded Are These Weapons Safely Secured?                            No data recorded Who Could Verify You Are Able To Have These Secured: No data recorded Do You Have any Outstanding Charges, Pending Court Dates, Parole/Probation? No data recorded Contacted To Inform of Risk of Harm To Self or Others: No data recorded  Location of Assessment: [facility] ED   Does Patient Present under Involuntary Commitment? No data recorded IVC Papers Initial File Date: No data recorded  [place] of Residence: No data recorded  Patient Currently Receiving the Following Services: No data recorded  Determination of Need: No data recorded  Options For Referral: No data recorded    CCA Biopsychosocial Intake/Chief Complaint:  No data recorded Current Symptoms/Problems: No data recorded  Patient Reported Schizophrenia/Schizoaffective Diagnosis in Past: No   Strengths: Patient came to the ER due to thoughts of ending his life.  Preferences: No data recorded Abilities: No data recorded  Type of Services Patient Feels are Needed: No data recorded  Initial Clinical Notes/Concerns: No data recorded  Mental  Health Symptoms Depression:   Difficulty Concentrating; Hopelessness; Increase/decrease in appetite; Sleep (too much or little); Worthlessness; Tearfulness   Duration of Depressive symptoms:  Greater than two weeks   Mania:   Racing thoughts   Anxiety:    Worrying; Tension; Sleep; Restlessness; Fatigue; Difficulty concentrating   Psychosis:   None   Duration of Psychotic symptoms: No data recorded  Trauma:   None   Obsessions:   None   Compulsions:   None   Inattention:   None   Hyperactivity/Impulsivity:   None   Oppositional/Defiant Behaviors:   None   Emotional Irregularity:   Frantic efforts to avoid abandonment; Intense/unstable relationships   Other Mood/Personality Symptoms:  No data recorded   Mental Status Exam Appearance and self-care  Stature:   Average   Weight:   Average weight   Clothing:   Neat/clean; Age-appropriate   Grooming:   Normal   Cosmetic use:   Age appropriate   Posture/gait:   Normal   Motor activity:   -- (Within normal range)   Sensorium  Attention:   Normal   Concentration:   Normal   Orientation:   X5   Recall/memory:   Normal   Affect and Mood  Affect:   Appropriate; Full  Range; Depressed; Anxious   Mood:   Anxious; Depressed   Relating  Eye contact:   Normal   Facial expression:   Anxious; Depressed   Attitude toward examiner:   Cooperative   Thought and Language  Speech flow:  Clear and Coherent; Normal   Thought content:   Appropriate to Mood and Circumstances   Preoccupation:   None   Hallucinations:   None   Organization:  No data recorded  [employer] of Knowledge:   Average   Intelligence:   Average   Abstraction:   Normal   Judgement:   Normal; Fair   Reality Testing:   Adequate   Insight:   Flashes of insight; Poor   Decision Making:   Confused   Social Functioning  Social Maturity:   Irresponsible   Social Judgement:   Naive;  "Garment/textile technologist   Stress  Stressors:   Relationship; Grief/losses   Coping Ability:   Normal   Skill Deficits:   None   Supports:   Friends/Service system     Religion: Religion/Spirituality Are You A Religious Person?: No  Leisure/Recreation: Leisure / Recreation Do You Have Hobbies?: No  Exercise/Diet: Exercise/Diet Do You Exercise?: No Have You Gained or Lost A Significant Amount of Weight in the Past Six Months?: No Do You Follow a Special Diet?: No Do You Have Any Trouble Sleeping?: No   CCA Employment/Education Employment/Work Situation: Employment / Work Situation Employment Situation: Unemployed Patient's Job has Been Impacted by Current Illness: No Has Patient ever Been in Equities trader?: No  Education: Education Is Patient Currently Attending School?: No Did Theme park manager?: No Did You Have An Individualized Education Program (IIEP): No Did You Have Any Difficulty At [employer]?: No Patient's Education Has Been Impacted by Current Illness: No   CCA Family/Childhood History Family and Relationship History: Family history Marital status: Long term relationship Long term relationship, how long?: 6 years ago What types of issues is patient dealing with in the relationship?: Afraid she is not faithful in relationship Additional relationship information: Current mental and emotional state occurred when he was with his ex-wife Does patient have children?: Yes How many children?: 1 How is patient's relationship with their children?: States it's good  Childhood History:  Childhood History By whom was/is the patient raised?: Mother Did patient suffer any verbal/emotional/physical/sexual abuse as a child?: No Did patient suffer from severe childhood neglect?: No Has patient ever been sexually abused/assaulted/raped as an adolescent or adult?: No Was the patient ever a victim of a crime or a disaster?: No Witnessed domestic violence?: No Has patient  been affected by domestic violence as an adult?: No  Child/Adolescent Assessment:    CCA Substance Use Alcohol/Drug Use: Alcohol / Drug Use Pain Medications: See PTA Prescriptions: See PTA Over the Counter: See PTA History of alcohol / drug use?: Yes Substance #1 Name of Substance 1: CrystalMeth 1 - Last Use / Amount: 06/292022 Substance #2 Name of Substance 2: Cocaine 2 - Last Use / Amount: Years ago Substance #3 Name of Substance 3: Cannabis 3 - Last Use / Amount: Many years ago                   ASAM's:  Six Dimensions of Multidimensional Assessment  Dimension 1:  Acute Intoxication and/or Withdrawal Potential:      Dimension 2:  Biomedical Conditions and Complications:      Dimension 3:  Emotional, Behavioral, or Cognitive Conditions and Complications:  Dimension 4:  Readiness to Change:     Dimension 5:  Relapse, Continued use, or Continued Problem Potential:     Dimension 6:  Recovery/Living Environment:     ASAM Severity Score:    ASAM Recommended Level of Treatment:     Substance use Disorder (SUD)    Recommendations for Services/Supports/Treatments:    DSM5 Diagnoses: Patient Active Problem List   Diagnosis Date Noted   Elevated BP without diagnosis of hypertension 09/06/2019   Pre-diabetes 09/02/2019   Low vitamin D level 09/02/2019   Chronic nonintractable headache 09/02/2019   Polysubstance (excluding opioids) dependence (HCC) 08/26/2019   Sleep disturbance 08/26/2019   Anxiety 08/26/2019   Atypical syncope 08/26/2019   Fatigue 08/26/2019    Patient Centered Plan: Patient is on the following Treatment Plan(s):  Depression  Referrals to Alternative Service(s): Referred to Alternative Service(s):   Place:   Date:   Time:    Referred to Alternative Service(s):   Place:   Date:   Time:    Referred to Alternative Service(s):   Place:   Date:   Time:    Referred to Alternative Service(s):   Place:   Date:   Time:     Lilyan Gilfordalvin J. Mallorey Odonell  MS, LCAS, [facility], [facility] Therapeutic Triage Specialist 08/05/2020 4:19 PM

## 2020-08-05 NOTE — ED Triage Notes (Signed)
Pt reports he has had depression, for 15 years but has never talked to anyone about it. He wrote a note not to let him leave that his next step is suicide. Pt does not make eye contact during triage.

## 2020-08-05 NOTE — ED Notes (Signed)
EDP at bedside  

## 2020-08-06 MED ORDER — RISPERIDONE 1 MG PO TBDP: 1.0000 mg | ORAL_TABLET | Freq: Two times a day (BID) | ORAL | 1 refills | Status: DC

## 2020-08-06 NOTE — Discharge Instructions (Signed)
[facility]  Mental health service in [place], [place] Address: [address], [place], [place] [address] Hours:  Open ? Closes 5PM Phone: [phone] 

## 2020-08-06 NOTE — ED Provider Notes (Signed)
Emergency Medicine Observation Re-evaluation Note  Andrew Parsons is a 40 y.o. male, seen on rounds today.  Pt initially presented to the ED for complaints of Suicidal Currently, the patient is resting, voices no medical complaints.  Physical Exam  BP (!) 145/70   Pulse 86   Temp 98.6 F (37 C) (Oral)   Resp 18   Ht 5\' 10"  (1.778 m)   Wt 94.8 kg   SpO2 99%   BMI 29.99 kg/m  Physical Exam General: Resting in no acute distress Cardiac: No cyanosis Lungs: Equal rise and fall Psych: Not agitated  ED Course / MDM  EKG:   I have reviewed the labs performed to date as well as medications administered while in observation.  Recent changes in the last 24 hours include no events overnight.  Plan  Current plan is for psychiatric disposition after COVID quarantine. Patient is not under full IVC at this time.   , MD 08/06/20 [phone]

## 2020-08-06 NOTE — ED Notes (Signed)
Pt ate 100% of his breakfast. Pt denies any further needs.

## 2020-08-06 NOTE — Consult Note (Signed)
[facility] Psych ED Discharge  08/06/2020 10:21 AM Andrew Parsons  MRN:  [id number]  Method of visit?: Face to Face   Principal Problem: Amphetamine and psychostimulant-induced psychosis with delusions [facility]) Discharge Diagnoses: Principal Problem:   Amphetamine and psychostimulant-induced psychosis with delusions (HCC) Active Problems:   COVID   Subjective: "That medicine made me think clearly."  40 yo male presented to the ED after using meth and having psychosis along with vague suicidal ideations, medications were started.  Today, he is clear and coherent with no psychosis, paranoia, or mania.  He liked how th Risperdal cleared his mind and wants to continue it.  He denies suicidal/homicidal ideations, withdrawal symptoms.  Declines substance abuse assistance, agreeable to follow up with [facility] for medications.  Minor anxiety related to wanting to leave, no other concerns.  Psychiatrically cleared for discharge.    Total Time spent with patient: 45 minutes  Past Psychiatric History: depression, substance abuse  Past Medical History:  Past Medical History:  Diagnosis Date   Alcohol abuse    Anxiety 08/26/2019   Atypical syncope 08/26/2019   Depression    Fainting spell    Fatigue 08/26/2019   Hyperlipidemia    Hypertension    Polysubstance (excluding opioids) dependence (HCC) 08/26/2019   Sleep disturbance 08/26/2019    Past Surgical History:  Procedure Laterality Date   TYMPANOPLASTY     Family History:  Family History  Problem Relation Age of Onset   Early death Mother    Stroke Mother    Depression Daughter    Hyperlipidemia Daughter    Family Psychiatric  History: see above Social History:  Social History   Substance and Sexual Activity  Alcohol Use Yes   Comment: not now but has abused it the past and episodic binge      Social History   Substance and Sexual Activity  Drug Use Not Currently   Types: Methamphetamines, Cocaine, Marijuana   Comment: Substance use since 40  yo     Social History   Socioeconomic History   Marital status: Single    Spouse name: Not on file   Number of children: Not on file   Years of education: Not on file   Highest education level: GED or equivalent  Occupational History   Occupation: Administrator   Tobacco Use   Smoking status: Every Day    Packs/day: 0.50    Pack years: 0.00    Types: Cigarettes   Smokeless tobacco: Never  Vaping Use   Vaping Use: Never used  Substance and Sexual Activity   Alcohol use: Yes    Comment: not now but has abused it the past and episodic binge    Drug use: Not Currently    Types: Methamphetamines, Cocaine, Marijuana    Comment: Substance use since 40 yo    Sexual activity: Yes  Other Topics Concern   Not on file  Social History Narrative   Lives with a friend . He has 2 children and grandkids .    Social Determinants of Health   Financial Resource Strain: Not on file  Food Insecurity: Not on file  Transportation Needs: Not on file  Physical Activity: Not on file  Stress: Not on file  Social Connections: Not on file    Tobacco Cessation:  A prescription for an FDA-approved tobacco cessation medication was offered at discharge and the patient refused  Current Medications: Current Facility-Administered Medications  Medication Dose Route Frequency Provider Last Rate Last Admin   risperiDONE (  RISPERDAL M-TABS) disintegrating tablet 1 mg  1 mg Oral BID Clapacs, Jackquline Denmark, MD   1 mg at 08/06/20 0911   Current Outpatient Medications  Medication Sig Dispense Refill   HYDROcodone-acetaminophen (NORCO/VICODIN) 5-325 MG tablet Take 1 tablet by mouth every 6 (six) hours as needed. 12 tablet 0   methocarbamol (ROBAXIN) 500 MG tablet Take 1 tablet (500 mg total) by mouth 4 (four) times daily. 12 tablet 0   predniSONE (DELTASONE) 10 MG tablet Take 6 tablets  today, on day 2 take 5 tablets, day 3 take 4 tablets, day 4 take 3 tablets, day 5 take  2 tablets and 1 tablet the last day 21  tablet 0   PTA Medications: (Not in a hospital admission)   Musculoskeletal: Strength & Muscle Tone: within normal limits Gait & Station: normal Patient leans: N/A  Psychiatric Specialty Exam: Physical Exam Vitals and nursing note reviewed.  Constitutional:      Appearance: Normal appearance.  HENT:     Head: Normocephalic.     Nose: Nose normal.  Pulmonary:     Effort: Pulmonary effort is normal.  Musculoskeletal:        General: Normal range of motion.     Cervical back: Normal range of motion.  Neurological:     General: No focal deficit present.     Mental Status: He is alert and oriented to person, place, and time.  Psychiatric:        Attention and Perception: Attention and perception normal.        Mood and Affect: Mood is anxious.        Speech: Speech normal.        Behavior: Behavior normal. Behavior is cooperative.        Thought Content: Thought content normal.        Cognition and Memory: Cognition and memory normal.        Judgment: Judgment normal.    Review of Systems  Psychiatric/Behavioral:  Positive for substance abuse. The patient is nervous/anxious.   All other systems reviewed and are negative.  Blood pressure (!) 141/77, pulse 70, temperature 98.1 F (36.7 C), temperature source Oral, resp. rate 18, height 5\' 10"  (1.778 m), weight 94.8 kg, SpO2 98 %.Body mass index is 29.99 kg/m.  General Appearance: Casual  Eye Contact:  Good  Speech:  Normal Rate  Volume:  Normal  Mood:  Anxious  Affect:  Congruent  Thought Process:  Coherent and Descriptions of Associations: Intact  Orientation:  Full (Time, Place, and Person)  Thought Content:  WDL and Logical  Suicidal Thoughts:  No  Homicidal Thoughts:  No  Memory:  Immediate;   Good Recent;   Good Remote;   Good  Judgement:  Fair  Insight:  Fair  Psychomotor Activity:  Normal  Concentration:  Concentration: Good and Attention Span: Good  Recall:  Good  Fund of Knowledge:  Good  Language:   Good  Akathisia:  No  Handed:  Right  AIMS (if indicated):     Assets:  Housing Leisure Time Physical Health Resilience Social Support  ADL's:  Intact  Cognition:  WNL  Sleep:         Physical Exam: Physical Exam Vitals and nursing note reviewed.  Constitutional:      Appearance: Normal appearance.  HENT:     Head: Normocephalic.     Nose: Nose normal.  Pulmonary:     Effort: Pulmonary effort is normal.  Musculoskeletal:  General: Normal range of motion.     Cervical back: Normal range of motion.  Neurological:     General: No focal deficit present.     Mental Status: He is alert and oriented to person, place, and time.  Psychiatric:        Attention and Perception: Attention and perception normal.        Mood and Affect: Mood is anxious.        Speech: Speech normal.        Behavior: Behavior normal. Behavior is cooperative.        Thought Content: Thought content normal.        Cognition and Memory: Cognition and memory normal.        Judgment: Judgment normal.   Review of Systems  Psychiatric/Behavioral:  Positive for substance abuse. The patient is nervous/anxious.   All other systems reviewed and are negative. Blood pressure (!) 141/77, pulse 70, temperature 98.1 F (36.7 C), temperature source Oral, resp. rate 18, height 5\' 10"  (1.778 m), weight 94.8 kg, SpO2 98 %. Body mass index is 29.99 kg/m.   Demographic Factors:  Male  Loss Factors: NA  Historical Factors: NA  Risk Reduction Factors:   Sense of responsibility to family, Living with another person, especially a relative, and Positive social support  Continued Clinical Symptoms:  Anxiety, mild  Cognitive Features That Contribute To Risk:  None    Suicide Risk:  Minimal: No identifiable suicidal ideation.  Patients presenting with no risk factors but with morbid ruminations; may be classified as minimal risk based on the severity of the depressive symptoms    Plan Of Care/Follow-up  recommendations:  Methamphetamine induced mood disorder: -Continue Risperdal 1 mg BID -Refrain from alcohol and drug use -Attend a 12-step program with a sponsor -Follow up with [facility] Activity:  as tolerated Diet:  heart healthy diet  Disposition: discharge home , NP 08/06/2020, 10:21 AM

## 2020-08-06 NOTE — ED Notes (Signed)
Pt signed physical discharge form and ambulated to lobby with no issue.

## 2020-08-06 NOTE — BH Assessment (Signed)
Late Entry: Writer spoke with the patient to complete an updated/reassessment. Patient denies SI/HI and AV/H.  Patient provided outpatient referral information to follow up with to address his mental health needs.

## 2020-08-15 NOTE — ED Provider Notes (Signed)
[facility] Emergency Department Provider Note   ____________________________________________   Event Date/Time   First MD Initiated Contact with Patient 08/05/20 1244     (approximate)  I have reviewed the triage vital signs and the nursing notes.   HISTORY  Chief Complaint Suicidal    HPI Andrew Parsons is a 40 y.o. male with below stated past medical history presents for depression with suicidal ideation.  Patient states that he has had the symptoms for the past few years but has never spoken to anyone about them.  Patient states he currently has a plan to "jump off a bridge".  Patient also having paranoid thoughts that people are pursuing him and out to get him as well as having an affair with his girlfriend.  Patient states that he sees a specific person's name written on different materials that he believes is having an affair with his girlfriend.  Currently denies any homicidal ideation.  Patient admits to intranasal methamphetamine use every day for the last 20 years including today just prior to arrival.  Patient currently denies any vision changes, tinnitus, difficulty speaking, facial droop, sore throat, chest pain, shortness of breath, abdominal pain, nausea/vomiting/diarrhea, dysuria, or weakness/numbness/paresthesias in any extremity          Past Medical History:  Diagnosis Date   Alcohol abuse    Anxiety 08/26/2019   Atypical syncope 08/26/2019   Depression    Fainting spell    Fatigue 08/26/2019   Hyperlipidemia    Hypertension    Polysubstance (excluding opioids) dependence (HCC) 08/26/2019   Sleep disturbance 08/26/2019    Patient Active Problem List   Diagnosis Date Noted   Amphetamine and psychostimulant-induced psychosis with delusions (HCC) 08/05/2020   COVID 08/05/2020   Elevated BP without diagnosis of hypertension 09/06/2019   Pre-diabetes 09/02/2019   Low vitamin D level 09/02/2019   Chronic nonintractable headache  09/02/2019   Polysubstance (excluding opioids) dependence (HCC) 08/26/2019   Sleep disturbance 08/26/2019   Anxiety 08/26/2019   Atypical syncope 08/26/2019   Fatigue 08/26/2019    Past Surgical History:  Procedure Laterality Date   TYMPANOPLASTY      Prior to Admission medications   Medication Sig Start Date End Date Taking? Authorizing Provider  risperiDONE (RISPERDAL M-TABS) 1 MG disintegrating tablet Take 1 tablet (1 mg total) by mouth 2 (two) times daily. 08/06/20   Charm Rings, NP    Allergies Patient has no known allergies.  Family History  Problem Relation Age of Onset   Early death Mother    Stroke Mother    Depression Daughter    Hyperlipidemia Daughter     Social History Social History   Tobacco Use   Smoking status: Every Day    Packs/day: 0.50    Pack years: 0.00    Types: Cigarettes   Smokeless tobacco: Never  Vaping Use   Vaping Use: Never used  Substance Use Topics   Alcohol use: Yes    Comment: not now but has abused it the past and episodic binge    Drug use: Not Currently    Types: Methamphetamines, Cocaine, Marijuana    Comment: Substance use since 40 yo     Review of Systems Constitutional: No fever/chills Eyes: No visual changes. ENT: No sore throat. Cardiovascular: Denies chest pain. Respiratory: Denies shortness of breath. Gastrointestinal: No abdominal pain.  No nausea, no vomiting.  No diarrhea. Genitourinary: Negative for dysuria. Musculoskeletal: Negative for acute arthralgias Skin: Negative for rash. Neurological: Negative  for headaches, weakness/numbness/paresthesias in any extremity Psychiatric: Negative for homicidal ideation   ____________________________________________   PHYSICAL EXAM:  VITAL SIGNS: ED Triage Vitals  Enc Vitals Group     BP 08/05/20 1227 (!) 158/110     Pulse Rate 08/05/20 1227 (!) 111     Resp 08/05/20 1227 18     Temp 08/05/20 1227 98.1 F (36.7 C)     Temp Source 08/05/20 1227 Oral      SpO2 08/05/20 1227 98 %     Weight 08/05/20 1229 209 lb (94.8 kg)     Height 08/05/20 1229 5\' 10"  (1.778 m)     Head Circumference --      Peak Flow --      Pain Score 08/06/20 1131 0     Pain Loc --      Pain Edu? --      Excl. in GC? --    Constitutional: Alert and oriented. Well appearing and in no acute distress. Eyes: Conjunctivae are normal. PERRL. Head: Atraumatic. Nose: No congestion/rhinnorhea. Mouth/Throat: Mucous membranes are moist. Neck: No stridor Cardiovascular: Grossly normal heart sounds.  Good peripheral circulation. Respiratory: Normal respiratory effort.  No retractions. Gastrointestinal: Soft and nontender. No distention. Musculoskeletal: No obvious deformities Neurologic:  Normal speech and language. No gross focal neurologic deficits are appreciated. Skin:  Skin is warm and dry. No rash noted. Psychiatric: Mood is depressed and affect is flat. Speech and behavior are normal.  ____________________________________________   LABS (all labs ordered are listed, but only abnormal results are displayed)  Labs Reviewed  RESP PANEL BY RT-PCR (FLU A&B, COVID) ARPGX2 - Abnormal; Notable for the following components:      Result Value   SARS Coronavirus 2 by RT PCR POSITIVE (*)    All other components within normal limits  COMPREHENSIVE METABOLIC PANEL - Abnormal; Notable for the following components:   Glucose, Bld 158 (*)    Calcium 8.6 (*)    ALT 46 (*)    All other components within normal limits  SALICYLATE LEVEL - Abnormal; Notable for the following components:   Salicylate Lvl <7.0 (*)    All other components within normal limits  ACETAMINOPHEN LEVEL - Abnormal; Notable for the following components:   Acetaminophen (Tylenol), Serum <10 (*)    All other components within normal limits  URINE DRUG SCREEN, QUALITATIVE ([facility] ONLY) - Abnormal; Notable for the following components:   Amphetamines, Ur Screen POSITIVE (*)    All other components within normal  limits  ETHANOL  CBC   PROCEDURES  Procedure(s) performed (including Critical Care):  Procedures   ____________________________________________   INITIAL IMPRESSION / ASSESSMENT AND PLAN / ED COURSE  As part of my medical decision making, I reviewed the following data within the electronic medical record, if available:  Nursing notes reviewed and incorporated, Labs reviewed, EKG interpreted, Old chart reviewed, Radiograph reviewed and Notes from prior ED visits reviewed and incorporated        Thoughts are linear and organized, and patient has no HI. Prior suicide attempt: denies Prior Psychiatric Hospitalizations: denies  Clinically patient displays no overt toxidrome; they are well appearing, with low suspicion for toxic ingestion given history and exam. Thoughts unlikely 2/2 anemia, hypothyroidism, infection, or ICH.  Consult: Psychiatry to evaluate patient for potential hold for danger to self. Disposition: Plan admit to psychiatry for further management of symptoms.       ____________________________________________   FINAL CLINICAL IMPRESSION(S) / ED DIAGNOSES  Final diagnoses:  Amphetamine and psychostimulant-induced psychosis with delusions [facility])     ED Discharge Orders          Ordered    risperiDONE (RISPERDAL M-TABS) 1 MG disintegrating tablet  2 times daily        08/06/20 1023    Increase activity slowly        08/06/20 1023    Diet - low sodium heart healthy        08/06/20 1023    Discharge instructions       Comments: Follow up with [facility]   08/06/20 1023             Note:  This document was prepared using Dragon voice recognition software and may include unintentional dictation errors.    Merwyn Katos, MD 08/15/20 1118

## 2021-02-16 ENCOUNTER — Emergency Department (HOSPITAL_COMMUNITY)
Admission: EM | Admit: 2021-02-16 | Discharge: 2021-02-17 | Disposition: A | Discharge status: Other Acute Inpatient Hospital | Payer: 59 | Attending: Emergency Medicine | Admitting: Emergency Medicine

## 2021-02-16 DIAGNOSIS — R45851 Suicidal ideations: Secondary | ICD-10-CM | POA: Insufficient documentation

## 2021-02-16 DIAGNOSIS — F1510 Other stimulant abuse, uncomplicated: Secondary | ICD-10-CM

## 2021-02-16 DIAGNOSIS — F323 Major depressive disorder, single episode, severe with psychotic features: Secondary | ICD-10-CM | POA: Diagnosis not present

## 2021-02-16 DIAGNOSIS — T43652A Poisoning by methamphetamines intentional self-harm, initial encounter: Secondary | ICD-10-CM | POA: Insufficient documentation

## 2021-02-16 DIAGNOSIS — T43622A Poisoning by amphetamines, intentional self-harm, initial encounter: Secondary | ICD-10-CM | POA: Insufficient documentation

## 2021-02-16 DIAGNOSIS — F333 Major depressive disorder, recurrent, severe with psychotic symptoms: Secondary | ICD-10-CM

## 2021-02-16 DIAGNOSIS — Z20822 Contact with and (suspected) exposure to covid-19: Secondary | ICD-10-CM | POA: Diagnosis not present

## 2021-02-16 NOTE — ED Provider Triage Note (Signed)
Emergency Medicine Provider Triage Evaluation Note  Janis Cuffe , a 41 y.o. male  was evaluated in triage.  Pt complains of suicidal ideations.  These been occurring intermittently over the past few weeks but have become more persistent.  Patient reports a plan to jump off a bridge.  Denies any HI.  Does report he has been having intermittent visual hallucinations.  Reports he has been having increasing issues with his significant other that have worsened his suicidal ideations.  Endorses using methamphetamine a few hours prior to arrival, denies other drug or alcohol use.  No medical complaints  Review of Systems  Positive: Suicidal ideations, visual hallucinations Negative: Homicidal ideations, chest pain, abdominal pain, fever  Physical Exam  BP (!) 153/96 (BP Location: Right Arm)    Pulse (!) 102    Temp 98.6 F (37 C) (Oral)    Resp 16    SpO2 97%  Gen:   Awake, no distress   Resp:  Normal effort  MSK:   Moves extremities without difficulty  Other:    Medical Decision Making  Medically screening exam initiated at 11:13 PM.  Appropriate orders placed.  Chip Canepa was informed that the remainder of the evaluation will be completed by another provider, this initial triage assessment does not replace that evaluation, and the importance of remaining in the ED until their evaluation is complete.     Dartha Lodge, [place] 02/16/21 2325

## 2021-02-16 NOTE — ED Triage Notes (Signed)
Pt states that he was going to jump off a bridge to try to kill himself today.  Hx of same.  Pt states that he has been using crystal meth.  Pt changed into scrubs and belongings removed from room.

## 2021-02-17 ENCOUNTER — Encounter (HOSPITAL_COMMUNITY): Payer: Self-pay | Admitting: Emergency Medicine

## 2021-02-17 ENCOUNTER — Inpatient Hospital Stay (HOSPITAL_COMMUNITY)
Admission: AD | Admit: 2021-02-17 | Discharge: 2021-02-23 | DRG: 885 | Disposition: A | Discharge status: Home / Self Care | Payer: 59 | Source: Intra-hospital | Attending: Psychiatry | Admitting: Psychiatry

## 2021-02-17 ENCOUNTER — Other Ambulatory Visit: Payer: Self-pay

## 2021-02-17 ENCOUNTER — Encounter (HOSPITAL_COMMUNITY): Payer: Self-pay | Admitting: Student

## 2021-02-17 DIAGNOSIS — I10 Essential (primary) hypertension: Secondary | ICD-10-CM | POA: Diagnosis present

## 2021-02-17 DIAGNOSIS — K5900 Constipation, unspecified: Secondary | ICD-10-CM | POA: Diagnosis present

## 2021-02-17 DIAGNOSIS — Z20822 Contact with and (suspected) exposure to covid-19: Secondary | ICD-10-CM | POA: Diagnosis present

## 2021-02-17 DIAGNOSIS — F419 Anxiety disorder, unspecified: Secondary | ICD-10-CM | POA: Diagnosis present

## 2021-02-17 DIAGNOSIS — Z79899 Other long term (current) drug therapy: Secondary | ICD-10-CM | POA: Diagnosis not present

## 2021-02-17 DIAGNOSIS — Z818 Family history of other mental and behavioral disorders: Secondary | ICD-10-CM | POA: Diagnosis not present

## 2021-02-17 DIAGNOSIS — F1520 Other stimulant dependence, uncomplicated: Secondary | ICD-10-CM | POA: Diagnosis present

## 2021-02-17 DIAGNOSIS — F333 Major depressive disorder, recurrent, severe with psychotic symptoms: Secondary | ICD-10-CM | POA: Diagnosis present

## 2021-02-17 DIAGNOSIS — Z560 Unemployment, unspecified: Secondary | ICD-10-CM

## 2021-02-17 DIAGNOSIS — F323 Major depressive disorder, single episode, severe with psychotic features: Secondary | ICD-10-CM | POA: Diagnosis not present

## 2021-02-17 DIAGNOSIS — Z23 Encounter for immunization: Secondary | ICD-10-CM | POA: Diagnosis not present

## 2021-02-17 DIAGNOSIS — Z823 Family history of stroke: Secondary | ICD-10-CM

## 2021-02-17 DIAGNOSIS — F17210 Nicotine dependence, cigarettes, uncomplicated: Secondary | ICD-10-CM | POA: Diagnosis present

## 2021-02-17 DIAGNOSIS — Z5900 Homelessness unspecified: Secondary | ICD-10-CM | POA: Diagnosis not present

## 2021-02-17 DIAGNOSIS — G4700 Insomnia, unspecified: Secondary | ICD-10-CM | POA: Diagnosis present

## 2021-02-17 DIAGNOSIS — F1590 Other stimulant use, unspecified, uncomplicated: Secondary | ICD-10-CM

## 2021-02-17 DIAGNOSIS — E785 Hyperlipidemia, unspecified: Secondary | ICD-10-CM | POA: Diagnosis present

## 2021-02-17 DIAGNOSIS — R45851 Suicidal ideations: Secondary | ICD-10-CM | POA: Diagnosis present

## 2021-02-17 LAB — CBC WITH DIFFERENTIAL/PLATELET
Abs Immature Granulocytes: 0.03 10*3/uL (ref 0.00–0.07)
Basophils Absolute: 0 10*3/uL (ref 0.0–0.1)
Basophils Relative: 0 %
Eosinophils Absolute: 0.2 10*3/uL (ref 0.0–0.5)
Eosinophils Relative: 2 %
HCT: 43.7 % (ref 39.0–52.0)
Hemoglobin: 14.9 g/dL (ref 13.0–17.0)
Immature Granulocytes: 0 %
Lymphocytes Relative: 26 %
Lymphs Abs: 2.2 10*3/uL (ref 0.7–4.0)
MCH: 30.4 pg (ref 26.0–34.0)
MCHC: 34.1 g/dL (ref 30.0–36.0)
MCV: 89.2 fL (ref 80.0–100.0)
Monocytes Absolute: 0.9 10*3/uL (ref 0.1–1.0)
Monocytes Relative: 11 %
Neutro Abs: 5.2 10*3/uL (ref 1.7–7.7)
Neutrophils Relative %: 61 %
Platelets: 232 10*3/uL (ref 150–400)
RBC: 4.9 MIL/uL (ref 4.22–5.81)
RDW: 12.1 % (ref 11.5–15.5)
WBC: 8.6 10*3/uL (ref 4.0–10.5)
nRBC: 0 % (ref 0.0–0.2)

## 2021-02-17 LAB — RAPID URINE DRUG SCREEN, HOSP PERFORMED
Amphetamines: POSITIVE — AB
Barbiturates: NOT DETECTED
Benzodiazepines: NOT DETECTED
Cocaine: NOT DETECTED
Opiates: NOT DETECTED
Tetrahydrocannabinol: NOT DETECTED

## 2021-02-17 LAB — COMPREHENSIVE METABOLIC PANEL
ALT: 30 U/L (ref 0–44)
AST: 25 U/L (ref 15–41)
Albumin: 4 g/dL (ref 3.5–5.0)
Alkaline Phosphatase: 77 U/L (ref 38–126)
Anion gap: 9 (ref 5–15)
BUN: 11 mg/dL (ref 6–20)
CO2: 27 mmol/L (ref 22–32)
Calcium: 9.4 mg/dL (ref 8.9–10.3)
Chloride: 104 mmol/L (ref 98–111)
Creatinine, Ser: 0.96 mg/dL (ref 0.61–1.24)
GFR, Estimated: >60 mL/min (ref >60)
Glucose, Bld: 90 mg/dL (ref 70–99)
Potassium: 3.7 mmol/L (ref 3.5–5.1)
Sodium: 140 mmol/L (ref 135–145)
Total Bilirubin: 0.7 mg/dL (ref 0.3–1.2)
Total Protein: 7.6 g/dL (ref 6.5–8.1)

## 2021-02-17 LAB — RESP PANEL BY RT-PCR (FLU A&B, COVID) ARPGX2
Influenza A by PCR: NEGATIVE
Influenza B by PCR: NEGATIVE
SARS Coronavirus 2 by RT PCR: NEGATIVE

## 2021-02-17 LAB — SALICYLATE LEVEL: Salicylate Lvl: <7 mg/dL — ABNORMAL LOW (ref 7.0–30.0)

## 2021-02-17 LAB — LIPID PANEL
Cholesterol: 163 mg/dL (ref 0–200)
HDL: 40 mg/dL — ABNORMAL LOW (ref >40)
LDL Cholesterol: 86 mg/dL (ref 0–99)
Total CHOL/HDL Ratio: 4.1 RATIO
Triglycerides: 183 mg/dL — ABNORMAL HIGH (ref <150)
VLDL: 37 mg/dL (ref 0–40)

## 2021-02-17 LAB — ETHANOL: Alcohol, Ethyl (B): <10 mg/dL (ref <10)

## 2021-02-17 LAB — TSH: TSH: 1.011 u[IU]/mL (ref 0.350–4.500)

## 2021-02-17 LAB — ACETAMINOPHEN LEVEL: Acetaminophen (Tylenol), Serum: <10 ug/mL — ABNORMAL LOW (ref 10–30)

## 2021-02-17 LAB — HEMOGLOBIN A1C
Hgb A1c MFr Bld: 5.7 % — ABNORMAL HIGH (ref 4.8–5.6)
Mean Plasma Glucose: 116.89 mg/dL

## 2021-02-17 MED ORDER — PNEUMOCOCCAL VAC POLYVALENT 25 MCG/0.5ML IJ INJ
0.5000 mL | INJECTION | INTRAMUSCULAR | Status: AC
  Administered 2021-02-18: 0.5 mL via INTRAMUSCULAR
  Filled 2021-02-17: qty 0.5

## 2021-02-17 MED ORDER — HYDROXYZINE HCL 25 MG PO TABS
25.0000 mg | ORAL_TABLET | Freq: Three times a day (TID) | ORAL | Status: DC | PRN
  Administered 2021-02-17 – 2021-02-20 (×2): 25 mg via ORAL
  Filled 2021-02-17 (×4): qty 1

## 2021-02-17 MED ORDER — SERTRALINE HCL 50 MG PO TABS
50.0000 mg | ORAL_TABLET | Freq: Every day | ORAL | Status: DC
  Administered 2021-02-18 – 2021-02-20 (×3): 50 mg via ORAL
  Filled 2021-02-17 (×6): qty 1

## 2021-02-17 MED ORDER — MAGNESIUM HYDROXIDE 400 MG/5ML PO SUSP: 30.0000 mL | Freq: Every day | ORAL | Status: DC | PRN

## 2021-02-17 MED ORDER — INFLUENZA VAC SPLIT QUAD 0.5 ML IM SUSY
0.5000 mL | PREFILLED_SYRINGE | INTRAMUSCULAR | Status: AC
  Administered 2021-02-18: 0.5 mL via INTRAMUSCULAR
  Filled 2021-02-17: qty 0.5

## 2021-02-17 MED ORDER — ACETAMINOPHEN 325 MG PO TABS
650.0000 mg | ORAL_TABLET | Freq: Four times a day (QID) | ORAL | Status: DC | PRN
  Administered 2021-02-17 – 2021-02-22 (×4): 650 mg via ORAL
  Filled 2021-02-17 (×4): qty 2

## 2021-02-17 MED ORDER — ALUM & MAG HYDROXIDE-SIMETH 200-200-20 MG/5ML PO SUSP: 30.0000 mL | ORAL | Status: DC | PRN

## 2021-02-17 MED ORDER — TRAZODONE HCL 50 MG PO TABS
50.0000 mg | ORAL_TABLET | Freq: Every evening | ORAL | Status: DC | PRN
  Filled 2021-02-17 (×3): qty 1

## 2021-02-17 MED ORDER — LORAZEPAM 1 MG PO TABS: 1.0000 mg | ORAL_TABLET | ORAL | Status: DC | PRN

## 2021-02-17 MED ORDER — ZIPRASIDONE MESYLATE 20 MG IM SOLR: 20.0000 mg | INTRAMUSCULAR | Status: DC | PRN

## 2021-02-17 MED ORDER — SERTRALINE HCL 25 MG PO TABS
25.0000 mg | ORAL_TABLET | Freq: Every day | ORAL | Status: AC
  Administered 2021-02-17: 25 mg via ORAL
  Filled 2021-02-17 (×2): qty 1

## 2021-02-17 MED ORDER — OLANZAPINE 5 MG PO TBDP: 5.0000 mg | ORAL_TABLET | Freq: Three times a day (TID) | ORAL | Status: DC | PRN

## 2021-02-17 NOTE — BH Assessment (Signed)
Comprehensive Clinical Assessment (CCA) Note  02/17/2021 [place] [id number]  Disposition: Margorie John, PA-C recommends inpatient treatment. Per Larose Kells, RN, pt has been accepted to [facility] and assigned to room/bed: 304-1. Attending physician: Dr. Nelda Marseille attending. Patient to arrive after 0800. Disposition discussed with Newman Nickels, RN.   [place] ED from 02/16/2021 in [place] ED from 08/05/2020 in [place] ED from 08/01/2020 in [place]  C-SSRS RISK CATEGORY High Risk High Risk No Risk      The patient demonstrates the following risk factors for suicide: Chronic risk factors for suicide include: psychiatric disorder of Major Depressive Disorder, recurrent, severe with psychotic features, Amphetamine-type substance use Disorder, severe  and substance use disorder. Acute risk factors for suicide include:  Pt is suicidal with a plan, no supports . Protective factors for this patient include:  None . Considering these factors, the overall suicide risk at this point appears to be high. Patient is not appropriate for outpatient follow up.  Jeron Lavy is 41 year old male who presents voluntary and unaccompanied to [facility]. Clinician asked the pt, "what brought you to the hospital?" Pt reports, he's suicidal with a plan to jump off a bridge. Pt reports, he's been having suicidal thoughts for a couple of months. Clinician asked the if he had previous suicide attempts, pt replied, a few months ago while in [place] he was seen at a bridge, someone call the police. Pt reports, the break up with an ex triggers his suicidal thoughts. Pt reports, he's been homeless for a couple of months but could not provide additional information to why he's homeless. During the assessment, clinician observed the pt discuss how much his ex means to him and cried. Pt reports, hearing  voices telling him to hurt himself.   Pt is unsure to the amount of Meth he used and is unsure how often he uses Meth. Pt's UDS is position for Methamphetamines. Pt denies, being linked to OPT resources (medication management and/or counseling.)   Pt presents with his eyes closed or had opened during the assessment with soft speech. Pt's mood, affect was depressed, anxious. Pt's insight was fair. Pt's judgement was poor. Pt reports, he can not contract for safety if discharged.   Diagnosis: Major Depressive Disorder, recurrent, severe with psychotic features.                     Amphetamine-type substance use Disorder, severe.  *Pt denies, having supports.*  Chief Complaint:  Chief Complaint  Patient presents with   Suicidal   Visit Diagnosis:     CCA Screening, Triage and Referral (STR)  Patient Reported Information How did you hear about [place]? Self  What Is the Reason for Your Visit/Call Today? Per EDP/PA note: "41 y/o male presents to the ED with c/o suicidal ideations. Per triage note, "These been occurring intermittently over the past few weeks but have become more persistent. Patient reports a plan to jump off a bridge. Denies any HI. Does report he has been having intermittent visual hallucinations.  Reports he has been having increasing issues with his significant other that have worsened his suicidal ideations. Endorses using methamphetamine a few hours prior to arrival, denies other drug or alcohol use. No medical complaints."  How Long Has This Been Causing You Problems? > than 6 months  What Do You Feel Would Help You the Most Today? Alcohol or Drug Use  Treatment; Treatment for Depression or other mood problem   Have You Recently Had Any Thoughts About Hurting Yourself? Yes  Are You Planning to Commit Suicide/Harm Yourself At This time? Yes   Have you Recently Had Thoughts About Hurting Someone Guadalupe Dawn? No  Are You Planning to Harm Someone at This Time? No  Explanation:  No data recorded  Have You Used Any Alcohol or Drugs in the Past 24 Hours? Yes  How Long Ago Did You Use Drugs or Alcohol? No data recorded What Did You Use and How Much? Pt reports, he smoked Methamphetamine. Pt is unsure of how much he smoked and how often he uses.   Do You Currently Have a Therapist/Psychiatrist? No  Name of Therapist/Psychiatrist: Recently started to [facility]   Have You Been Recently Discharged From Any Office Practice or Programs? No  Explanation of Discharge From Practice/Program: No data recorded    CCA Screening Triage Referral Assessment Type of Contact: Tele-Assessment  Telemedicine Service Delivery: Telemedicine service delivery: This service was provided via telemedicine using a 2-way, interactive audio and video technology  Is this Initial or Reassessment? Initial Assessment  Date Telepsych consult ordered in [facility]:  02/17/21  Time Telepsych consult ordered in [facility]:  2324  Location of Assessment: [facility] ED  Provider Location: [facility] Assessment Services   Collateral Involvement: Pt denies, having supports.   Does Patient Have a Stage manager Guardian? No data recorded Name and Contact of Legal Guardian: No data recorded If Minor and Not Living with Parent(s), Who has Custody? No data recorded Is CPS involved or ever been involved? Never  Is APS involved or ever been involved? Never   Patient Determined To Be At Risk for Harm To Self or Others Based on Review of Patient Reported Information or Presenting Complaint? Yes, for Self-Harm  Method: No data recorded Availability of Means: No data recorded Intent: No data recorded Notification Required: No data recorded Additional Information for Danger to Others Potential: No data recorded Additional Comments for Danger to Others Potential: No data recorded Are There Guns or Other Weapons in Your Home? No data recorded Types of Guns/Weapons: No data recorded Are These Weapons Safely Secured?                             No data recorded Who Could Verify You Are Able To Have These Secured: No data recorded Do You Have any Outstanding Charges, Pending Court Dates, Parole/Probation? No data recorded Contacted To Inform of Risk of Harm To Self or Others: No data recorded   Does Patient Present under Involuntary Commitment? No  IVC Papers Initial File Date: No data recorded  [place] of Residence: [place]   Patient Currently Receiving the Following Services: Not Receiving Services   Determination of Need: Emergent (2 hours)   Options For Referral: Inpatient Hospitalization; Medication Management; Outpatient Therapy; [facility]     CCA Biopsychosocial Patient Reported Schizophrenia/Schizoaffective Diagnosis in Past: No   Strengths: Patient came to the ER due to thoughts of ending his life.   Mental Health Symptoms Depression:   Difficulty Concentrating; Hopelessness; Increase/decrease in appetite; Worthlessness; Tearfulness; Fatigue   Duration of Depressive symptoms:    Mania:   Racing thoughts   Anxiety:    Worrying; Tension; Restlessness; Fatigue; Difficulty concentrating; Irritability   Psychosis:   None   Duration of Psychotic symptoms:    Trauma:   None   Obsessions:   None  Compulsions:   None   Inattention:   Forgetful   Hyperactivity/Impulsivity:   Feeling of restlessness   Oppositional/Defiant Behaviors:   Angry   Emotional Irregularity:   Recurrent suicidal behaviors/gestures/threats   Other Mood/Personality Symptoms:  No data recorded   Mental Status Exam Appearance and self-care  Stature:   Average   Weight:   Average weight   Clothing:   -- (Pt in scrubs.)   Grooming:   Normal   Cosmetic use:   Age appropriate   Posture/gait:   Normal   Motor activity:   -- (Pt laying in hospital bed.)   Sensorium  Attention:   Normal   Concentration:   Normal   Orientation:   X5   Recall/memory:   Normal    Affect and Mood  Affect:   Flat   Mood:   Depressed   Relating  Eye contact:   Normal   Facial expression:   Anxious; Depressed   Attitude toward examiner:   Cooperative   Thought and Language  Speech flow:  Soft   Thought content:   Appropriate to Mood and Circumstances   Preoccupation:   None   Hallucinations:   Auditory; Command (Comment)   Organization:  No data recorded  [employer] of Knowledge:   Average   Intelligence:   Average   Abstraction:   Normal   Judgement:   Poor   Reality Testing:   Adequate   Insight:   Fair   Decision Making:   Impulsive   Social Functioning  Social Maturity:   Impulsive   Social Judgement:   "Games developer"   Stress  Stressors:   Housing; Teacher, music Ability:   Normal; Deficient supports   Skill Deficits:   Environmental health practitioner   Supports:   Support needed     Religion: Religion/Spirituality Are You A Religious Person?:  (Per pt, "I believe in God.")  Leisure/Recreation: Leisure / Recreation Do You Have Hobbies?: No  Exercise/Diet: Exercise/Diet Do You Follow a Special Diet?:  (Pt reports, he has no appetite.) Do You Have Any Trouble Sleeping?:  (Pt reports, he's not sure about his sleep.)   CCA Employment/Education Employment/Work Situation: Employment / Work Situation Employment Situation: Unemployed Patient's Job has Been Impacted by Current Illness: No Has Patient ever Been in Passenger transport manager?: No  Education: Education Is Patient Currently Attending School?: No Last Grade Completed:  (GED) Did You Attend College?: No   CCA Family/Childhood History Family and Relationship History: Family history Marital status: Single (Pt reports, he was married for 15 years.) Does patient have children?: Yes How many children?: 2 How is patient's relationship with their children?: Pt reports, he has two grown children and two grandkids.  Childhood History:  Childhood  History By whom was/is the patient raised?: Mother Did patient suffer any verbal/emotional/physical/sexual abuse as a child?: No Did patient suffer from severe childhood neglect?: No Has patient ever been sexually abused/assaulted/raped as an adolescent or adult?: No Was the patient ever a victim of a crime or a disaster?: No Witnessed domestic violence?: No Has patient been affected by domestic violence as an adult?:  (NA)  Child/Adolescent Assessment:     CCA Substance Use Alcohol/Drug Use: Alcohol / Drug Use Pain Medications: See MAR Prescriptions: See MAR Over the Counter: See MAR History of alcohol / drug use?: Yes Longest period of sobriety (when/how long): Per pt, a few months. Substance #1 Name of Substance 1: Methamphetamines. 1 - Age of First  Use: Per pt, for 20 years. 1 - Amount (size/oz): Pt is unsure to the amount of Meth he used. Pt's UDS is position for Methamphetamines. 1 - Frequency: Pt is unsure how often he uses Meth. 1 - Duration: Ongoing. 1 - Last Use / Amount: Pt reports, a few hours before coming to the hospital. 1 - Method of Aquiring: Purchase. 1- Route of Use: Inhalation.     ASAM's:  Six Dimensions of Multidimensional Assessment  Dimension 1:  Acute Intoxication and/or Withdrawal Potential:      Dimension 2:  Biomedical Conditions and Complications:      Dimension 3:  Emotional, Behavioral, or Cognitive Conditions and Complications:     Dimension 4:  Readiness to Change:     Dimension 5:  Relapse, Continued use, or Continued Problem Potential:     Dimension 6:  Recovery/Living Environment:     ASAM Severity Score:    ASAM Recommended Level of Treatment:     Substance use Disorder (SUD)    Recommendations for Services/Supports/Treatments: Recommendations for Services/Supports/Treatments Recommendations For Services/Supports/Treatments: Inpatient Hospitalization  Discharge Disposition:    DSM5 Diagnoses: Patient Active Problem List    Diagnosis Date Noted   Amphetamine and psychostimulant-induced psychosis with delusions ([place]) 08/05/2020   COVID 08/05/2020   Elevated BP without diagnosis of hypertension 09/06/2019   Pre-diabetes 09/02/2019   Low vitamin D level 09/02/2019   Chronic nonintractable headache 09/02/2019   Polysubstance (excluding opioids) dependence (HCC) 08/26/2019   Sleep disturbance 08/26/2019   Anxiety 08/26/2019   Atypical syncope 08/26/2019   Fatigue 08/26/2019     Referrals to Alternative Service(s): Referred to Alternative Service(s):   Place:   Date:   Time:    Referred to Alternative Service(s):   Place:   Date:   Time:    Referred to Alternative Service(s):   Place:   Date:   Time:    Referred to Alternative Service(s):   Place:   Date:   Time:     Vertell Novak, [facility] Comprehensive Clinical Assessment (CCA) Screening, Triage and Referral Note  02/17/2021 Jontay Cornwell [id number]  Chief Complaint:  Chief Complaint  Patient presents with   Suicidal   Visit Diagnosis:   Patient Reported Information How did you hear about [place]? Self  What Is the Reason for Your Visit/Call Today? Per EDP/PA note: "41 y/o male presents to the ED with c/o suicidal ideations. Per triage note, "These been occurring intermittently over the past few weeks but have become more persistent. Patient reports a plan to jump off a bridge. Denies any HI. Does report he has been having intermittent visual hallucinations.  Reports he has been having increasing issues with his significant other that have worsened his suicidal ideations. Endorses using methamphetamine a few hours prior to arrival, denies other drug or alcohol use. No medical complaints."  How Long Has This Been Causing You Problems? > than 6 months  What Do You Feel Would Help You the Most Today? Alcohol or Drug Use Treatment; Treatment for Depression or other mood problem   Have You Recently Had Any Thoughts About Hurting Yourself? Yes  Are You  Planning to Commit Suicide/Harm Yourself At This time? Yes   Have you Recently Had Thoughts About Hurting Someone Guadalupe Dawn? No  Are You Planning to Harm Someone at This Time? No  Explanation: No data recorded  Have You Used Any Alcohol or Drugs in the Past 24 Hours? Yes  How Long Ago Did You Use Drugs or Alcohol? No  data recorded What Did You Use and How Much? Pt reports, he smoked Methamphetamine. Pt is unsure of how much he smoked and how often he uses.   Do You Currently Have a Therapist/Psychiatrist? No  Name of Therapist/Psychiatrist: Recently started to [facility]   Have You Been Recently Discharged From Any Office Practice or Programs? No  Explanation of Discharge From Practice/Program: No data recorded   CCA Screening Triage Referral Assessment Type of Contact: Tele-Assessment  Telemedicine Service Delivery: Telemedicine service delivery: This service was provided via telemedicine using a 2-way, interactive audio and video technology  Is this Initial or Reassessment? Initial Assessment  Date Telepsych consult ordered in [facility]:  02/17/21  Time Telepsych consult ordered in [facility]:  2324  Location of Assessment:  [facility] ED  Provider Location: [facility] Assessment Services   Collateral Involvement: Pt denies, having supports.   Does Patient Have a Stage manager Guardian? No data recorded Name and Contact of Legal Guardian: No data recorded If Minor and Not Living with Parent(s), Who has Custody? No data recorded Is CPS involved or ever been involved? Never  Is APS involved or ever been involved? Never   Patient Determined To Be At Risk for Harm To Self or Others Based on Review of Patient Reported Information or Presenting Complaint? Yes, for Self-Harm  Method: No data recorded Availability of Means: No data recorded Intent: No data recorded Notification Required: No data recorded Additional Information for Danger to Others Potential: No data recorded Additional Comments  for Danger to Others Potential: No data recorded Are There Guns or Other Weapons in Your Home? No data recorded Types of Guns/Weapons: No data recorded Are These Weapons Safely Secured?                            No data recorded Who Could Verify You Are Able To Have These Secured: No data recorded Do You Have any Outstanding Charges, Pending Court Dates, Parole/Probation? No data recorded Contacted To Inform of Risk of Harm To Self or Others: No data recorded  Does Patient Present under Involuntary Commitment? No  IVC Papers Initial File Date: No data recorded  [place] of Residence: [place]   Patient Currently Receiving the Following Services: Not Receiving Services   Determination of Need: Emergent (2 hours)   Options For Referral: Inpatient Hospitalization; Medication Management; Outpatient Therapy; [facility]   Discharge Disposition:     Vertell Novak, [place], [place], [facility], [facility] Triage Specialist [phone]

## 2021-02-17 NOTE — Progress Notes (Addendum)
Pt admitted at [facility] due to suicidal thoughts, visual hallucinations, insomnia, and depression.  Pt says he has a plan to jump off a bridge.  Pt disclosed that he is having family conflict with his girlfriend of 8 years, with whom he uses amphetamines.  Pt also has conflict with his 2 adult children.   Pt does not have a job and is homeless.  Pt was tearful on admission and says his goals are to work on better ways to handle his emotional pain and to increase his motivation to stop using drugs.  Pt admitted voluntarily.  Pt signed paperwork and verbalized his anticipated plan of care.

## 2021-02-17 NOTE — Tx Team (Signed)
Initial Treatment Plan 02/17/2021 6:23 PM Mattison Stuckey [id number]    PATIENT STRESSORS: Marital or family conflict   Occupational concerns   Substance abuse     PATIENT STRENGTHS: Ability for insight  Average or above average intelligence  Motivation for treatment/growth    PATIENT IDENTIFIED PROBLEMS: Suicidal ideation  Substance use disorder  Anxiety                 DISCHARGE CRITERIA:  Ability to meet basic life and health needs Improved stabilization in mood, thinking, and/or behavior  PRELIMINARY DISCHARGE PLAN: Outpatient therapy Medication management  PATIENT/FAMILY INVOLVEMENT: This treatment plan has been presented to and reviewed with the patient, Andrew Parsons.  The patient has been given the opportunity to ask questions and make suggestions.  Garnette Scheuermann, RN 02/17/2021, 6:23 PM

## 2021-02-17 NOTE — BHH Suicide Risk Assessment (Addendum)
Suicide Risk Assessment  Admission Assessment    [facility] Admission Suicide Risk Assessment   Nursing information obtained from:    Demographic factors:    Current Mental Status:    Loss Factors:    Historical Factors:    Risk Reduction Factors:     Total Time spent with patient: 45 minutes Principal Problem: MDD (major depressive disorder), recurrent, severe, with psychosis ([place]) Diagnosis:  Principal Problem:   MDD (major depressive disorder), recurrent, severe, with psychosis ([place])  Subjective Data: Andrew Parsons is a 41 yo patient with no PPH documented in his EMR who presented to [facility] voluntarily endorsing SI and was transferred to [facility].  Patient reports that prior to presenting to [facility] he was having SI w/ plan to jump from a bridge and attempted to reach out to his partner, "but she did not care." Patient reports that he is not having SI today and contracts for safety. Patient reports that he has not been sleeping well the past few weeks. Patient reports that he has been having trouble both falling asleep and staying asleep. Patient reports that at times he will cry and this keeps him up at night. Patient endorses anhedonia, feelings of guilt regarding his life decisions, and decrease concentration. Patient denies HI and AH. Patient reports that he had VH yesterday, but none today. Patient reports that he will occasionally have VH after his meth use and endorse that is always consist of him seeing words on walls and tables.  Continued Clinical Symptoms:    The "Alcohol Use Disorders Identification Test", Guidelines for Use in Primary Care, Second Edition.  World [employer] [facility]). Score between 0-7:  no or low risk or alcohol related problems. Score between 8-15:  moderate risk of alcohol related problems. Score between 16-19:  high risk of alcohol related problems. Score 20 or above:  warrants further diagnostic evaluation for alcohol dependence and treatment.   CLINICAL FACTORS:    Depression:   Anhedonia Insomnia Severe Alcohol/Substance Abuse/Dependencies   Musculoskeletal: Strength & Muscle Tone: within normal limits Gait & Station: normal Patient leans: N/A  Psychiatric Specialty Exam:  Presentation  General Appearance: Casual (wrapped in a blanket)  Eye Contact:Minimal  Speech:Clear and Coherent  Speech Volume:Decreased  Handedness:No data recorded  Mood and Affect  Mood:Depressed  Affect:Congruent   Thought Process  Thought Processes:Coherent  Descriptions of Associations:Intact  Orientation:Full (Time, Place and Person)  Thought Content:Perseveration; Logical  History of Schizophrenia/Schizoaffective disorder:No  Duration of Psychotic Symptoms:No data recorded Hallucinations:Hallucinations: None  Ideas of Reference:None  Suicidal Thoughts:Suicidal Thoughts: No  Homicidal Thoughts:Homicidal Thoughts: No   Sensorium  Memory:Immediate Good; Recent Good; Remote Good  Judgment:-- (Improving)  Insight:Shallow   Executive Functions  Concentration:Good  Attention Span:Fair  Recall:No data recorded Fund of Knowledge:Fair  Language:Fair   Psychomotor Activity  Psychomotor Activity:Psychomotor Activity: Normal   Assets  Assets:Communication Skills; Resilience   Sleep  Sleep:Sleep: Poor    Physical Exam: Physical Exam ROS There were no vitals taken for this visit. There is no height or weight on file to calculate BMI.   COGNITIVE FEATURES THAT CONTRIBUTE TO RISK:  None    SUICIDE RISK:   Severe:  Frequent, intense, and enduring suicidal ideation, specific plan, no subjective intent, but some objective markers of intent (i.e., choice of lethal method), the method is accessible, some limited preparatory behavior, evidence of impaired self-control, severe dysphoria/symptomatology, multiple risk factors present, and few if any protective factors, particularly a lack of social support.  PLAN OF  CARE: Admit  due to recent SI w/ plan as well as multiple other symptoms of depression and willingness to seek help for Substance use. He needs crisis stabilization, safety monitoring and medication management.      I certify that inpatient services furnished can reasonably be expected to improve the patient's condition.    PGY-2 Freida Busman, MD 02/17/2021, 3:05 PM

## 2021-02-17 NOTE — H&P (Signed)
Psychiatric Admission Assessment Adult  Patient Identification: Andrew Parsons MRN:  [id number] Date of Evaluation:  02/17/2021 Chief Complaint:  MDD (major depressive disorder), recurrent, severe, with psychosis ([place]) [F33.3] Principal Diagnosis: MDD (major depressive disorder), recurrent, severe, with psychosis ([place]) Diagnosis:  Principal Problem:   MDD (major depressive disorder), recurrent, severe, with psychosis ([place])  History of Present Illness: Andrew Parsons is a 41 yo patient with no PPH documented in his EMR who presented to [facility] voluntarily endorsing SI and was transferred to [facility]   On assessment today patient is Aox4. Patient reports that he felt that he needed help with his mental health due to having "issues w/ love." Patient reports that he feels that he is "holding on to something [ relationship] that I shouldn't." Patient reports that he has become so "wrapped up in the relationship" that he finds himself constantly thinking about the actions of his partner. 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 Patient reports that he lost his car "because I just wouldn't go to the towing place to pick it up." Patient reports that he had the money to pick up his car, but was too depressed and worried about his relationship to do so. Patient reports that prior to presenting to [facility] he was having SI w/ plan to jump from a bridge and attempted to reach out to his partner, "but she did not care."   Patient reports that he is not having SI today and contracts for safety. Patient reports that he has not been sleeping well the past few weeks. Patient reports that he has been having trouble both falling asleep and staying asleep. Patient reports that at times he will cry and this keeps him up at night. Patient endorses anhedonia, feelings of guilt regarding his life decisions, and decrease concentration. Patient denies HI and AH.  Patient reports that he had VH yesterday, but none today. Patient reports that he will occasionally have VH after his meth use and endorse that is always consist of him seeing words on walls and tables. Patient reports that this has happened so many times, that he knows it is not real.  Patient reports that he is not having VH today.   Patient does not currently meet criteria for either a current or previous hypomanic/ manic episode. Patient reports that he has used meth for 20 years frequently. Patient recalls an episode of lack of sleep for 3-4 days where he chose to draw, work, and exercise but does not recall being more impulsive or irritable.   Patient denies significant anxiety and nervousness. Patient reports that he finds himself only worrying about necessities such as a place to sleep or how to get to work.    Patient denies any trauma with PTSD symptoms.   Substance use evaluation: Patient reports use of [meth]. First use was [20 years ago].  Frequency is [daily].    Last use was [1/11].    Patient has [not ] required hospitalization for alcohol withdrawal in the past .   Methamphetamines  [Y] increased use [Y] unsuccessful efforts to cut down [Y, 8 hours] time spent to obtain, craving the substance []  failure to fulfill obligations due to substance use [Y] use despite interpersonal problems [Y] giving up social or occupational activities due to substance use []  use in hazardous situations [Y] use despite physical or pyschiologic harm [Y] symptoms of tolerance []  withdrawal symptoms.  Associated Signs/Symptoms: Depression Symptoms:  depressed mood, anhedonia, insomnia, feelings of worthlessness/guilt, difficulty concentrating, suicidal thoughts with specific plan, disturbed sleep, Duration of Depression Symptoms: Greater than two weeks  (Hypo) Manic Symptoms:   Not currently endorsing Anxiety Symptoms:   Denies Psychotic Symptoms:   Denies currently PTSD  Symptoms: NA Total Time spent with patient: 45 minutes  Past Psychiatric History: None documented. No prior inpt, OP. Or medications.  Patient reports approx 3 mon ago he thought about jumping from a bridge after an argument with his partner. Patient reports that the police saw patient on the bridge (where he had been having the argument) and talked to patient for 3 hours and also called their mental health personnel.   Is the patient at risk to self? Yes.    Has the patient been a risk to self in the past 6 months? Yes.    Has the patient been a risk to self within the distant past? No.  Is the patient a risk to others? No.  Has the patient been a risk to others in the past 6 months? No.  Has the patient been a risk to others within the distant past? No.   Prior Inpatient Therapy:   Prior Outpatient Therapy:    Alcohol Screening:   Substance Abuse History in the last 12 months:  Yes.   Consequences of Substance Abuse: Family Consequences:  Kicked out of daughter's house Previous Psychotropic Medications: No  Psychological Evaluations: No  Past Medical History:  Past Medical History:  Diagnosis Date   Alcohol abuse    Anxiety 08/26/2019   Atypical syncope 08/26/2019   Depression    Fainting spell    Fatigue 08/26/2019   Hyperlipidemia    Hypertension    Polysubstance (excluding opioids) dependence ([place]) 08/26/2019   Sleep disturbance 08/26/2019    Past Surgical History:  Procedure Laterality Date   TYMPANOPLASTY     Family History:  Family History  Problem Relation Age of Onset   Early death Mother    Stroke Mother    Depression Daughter    Hyperlipidemia Daughter    Family Psychiatric  History:  Paternal 56 st cousin: schizophrenia Tobacco Screening:   Social History:  Social History   Substance and Sexual Activity  Alcohol Use Yes   Comment: not now but has abused it the past and episodic binge      Social History   Substance and Sexual Activity  Drug Use Not  Currently   Types: Methamphetamines, Cocaine, Marijuana   Comment: Substance use since 41 yo     Additional Social History:     - Been w/ current partner 92 years - Was married for 15 years - 2 children ages 58 and 83 - 2 grandchildren, 35 and 7 yo - Patient and partner uses meth together and have a network of friends who use the substance - Has been working at a Programme researcher, 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  The SARS-CoV-2 RNA is generally detectable in upper respiratory specimens during the acute phase of infection. The lowest concentration of SARS-CoV-2 viral copies this assay can detect is 138 copies/mL. A negative result does not preclude SARS-Cov-2 infection and should not be used as the sole basis for treatment or other patient management decisions. A negative result may occur with  improper specimen collection/handling, submission of specimen other than nasopharyngeal swab, presence of viral mutation(s) within the areas targeted by this assay, and inadequate number of viral copies(<138 copies/mL). A negative result must be combined with clinical observations, patient history, and epidemiological information. The expected result is Negative.  Fact Sheet for Patients:  EntrepreneurPulse.com.au  Fact Sheet for Healthcare Providers:   IncredibleEmployment.be  This test is no t yet approved or cleared by the [place] FDA and  has been authorized for detection and/or diagnosis of SARS-CoV-2 by FDA under an Emergency Use Authorization (EUA). This EUA will remain  in effect (meaning this test can be used) for the duration of the COVID-19 declaration under Section 564(b)(1) of the Act, 21 U.S.C.section 360bbb-3(b)(1), unless the authorization is terminated  or revoked sooner.       Influenza A by PCR NEGATIVE NEGATIVE   Influenza B by PCR NEGATIVE NEGATIVE    Comment: (NOTE) The Xpert Xpress SARS-CoV-2/FLU/RSV plus assay is intended as an aid in the diagnosis of influenza from Nasopharyngeal swab specimens and should not be used as a sole basis for treatment. Nasal washings and aspirates are unacceptable for Xpert Xpress SARS-CoV-2/FLU/RSV testing.  Fact Sheet for Patients: EntrepreneurPulse.com.au  Fact Sheet for Healthcare Providers: IncredibleEmployment.be  This test is not yet approved or cleared by the [place] FDA and has been authorized for detection and/or diagnosis of SARS-CoV-2 by FDA under an Emergency Use Authorization (EUA). This EUA will remain in effect (meaning this test can be used) for the duration of the COVID-19 declaration under Section 564(b)(1) of the Act, 21 U.S.C. section 360bbb-3(b)(1), unless the authorization is terminated or revoked.  Performed at [facility], [place] [address] [place], [place] [address]   Urine rapid drug screen (hosp performed)     Status: Abnormal   Collection Time: 02/16/21 11:22 PM  Result Value Ref Range   Opiates NONE DETECTED NONE DETECTED   Cocaine NONE DETECTED NONE DETECTED   Benzodiazepines NONE DETECTED NONE DETECTED   Amphetamines POSITIVE (A) NONE DETECTED   Tetrahydrocannabinol NONE DETECTED NONE DETECTED   Barbiturates NONE DETECTED NONE DETECTED    Comment: (NOTE) DRUG  SCREEN FOR MEDICAL PURPOSES ONLY.  IF CONFIRMATION IS NEEDED FOR ANY PURPOSE, NOTIFY LAB WITHIN 5 DAYS.  LOWEST DETECTABLE LIMITS FOR URINE DRUG SCREEN Drug Class                     Cutoff (ng/mL) Amphetamine and metabolites    1000 Barbiturate and metabolites    200 Benzodiazepine                 [id number] Tricyclics and metabolites     300 Opiates and metabolites        300 Cocaine and metabolites        300 THC  50 Performed at [facility], [place] [address], [place], [place] [address]   Comprehensive metabolic panel     Status: None   Collection Time: 02/16/21 11:27 PM  Result Value Ref Range   Sodium 140 135 - 145 mmol/L   Potassium 3.7 3.5 - 5.1 mmol/L   Chloride 104 98 - 111 mmol/L   CO2 27 22 - 32 mmol/L   Glucose, Bld 90 70 - 99 mg/dL    Comment: Glucose reference range applies only to samples taken after fasting for at least 8 hours.   BUN 11 6 - 20 mg/dL   Creatinine, Ser 0.96 0.61 - 1.24 mg/dL   Calcium 9.4 8.9 - 10.3 mg/dL   Total Protein 7.6 6.5 - 8.1 g/dL   Albumin 4.0 3.5 - 5.0 g/dL   AST 25 15 - 41 U/L   ALT 30 0 - 44 U/L   Alkaline Phosphatase 77 38 - 126 U/L   Total Bilirubin 0.7 0.3 - 1.2 mg/dL   GFR, Estimated >60 >60 mL/min    Comment: (NOTE) Calculated using the CKD-EPI Creatinine Equation (2021)    Anion gap 9 5 - 15    Comment: Performed at [place] [address], [place], [place] [address]  Ethanol     Status: None   Collection Time: 02/16/21 11:27 PM  Result Value Ref Range   Alcohol, Ethyl (B) <10 <10 mg/dL    Comment: (NOTE) Lowest detectable limit for serum alcohol is 10 mg/dL.  For medical purposes only. Performed at [facility], [place] [address] [place], [place] [address]   CBC with Diff     Status: None   Collection Time: 02/16/21 11:27 PM  Result Value Ref Range   WBC 8.6 4.0 - 10.5 K/uL   RBC 4.90 4.22 - 5.81 MIL/uL   Hemoglobin 14.9 13.0 - 17.0 g/dL   HCT 43.7 39.0 - 52.0 %   MCV  89.2 80.0 - 100.0 fL   MCH 30.4 26.0 - 34.0 pg   MCHC 34.1 30.0 - 36.0 g/dL   RDW 12.1 11.5 - 15.5 %   Platelets 232 150 - 400 K/uL   nRBC 0.0 0.0 - 0.2 %   Neutrophils Relative % 61 %   Neutro Abs 5.2 1.7 - 7.7 K/uL   Lymphocytes Relative 26 %   Lymphs Abs 2.2 0.7 - 4.0 K/uL   Monocytes Relative 11 %   Monocytes Absolute 0.9 0.1 - 1.0 K/uL   Eosinophils Relative 2 %   Eosinophils Absolute 0.2 0.0 - 0.5 K/uL   Basophils Relative 0 %   Basophils Absolute 0.0 0.0 - 0.1 K/uL   Immature Granulocytes 0 %   Abs Immature Granulocytes 0.03 0.00 - 0.07 K/uL    Comment: Performed at [facility], [address], [place], [place] [id number]  Salicylate level     Status: Abnormal   Collection Time: 02/16/21 11:27 PM  Result Value Ref Range   Salicylate Lvl [id number] (L) 7.0 - 30.0 mg/dL    Comment: Performed at [place] [address], [place], [place] [address]  Acetaminophen level     Status: Abnormal   Collection Time: 02/16/21 11:27 PM  Result Value Ref Range   Acetaminophen (Tylenol), Serum <10 (L) 10 - 30 ug/mL    Comment: (NOTE) Therapeutic concentrations vary significantly. A range of 10-30 ug/mL  may be an effective concentration for many patients. However, some  are best treated  at concentrations outside of this range. Acetaminophen concentrations >150 ug/mL at 4 hours after ingestion  and >50 ug/mL at 12 hours after ingestion are often associated with  toxic reactions.  Performed at [facility], [place] [address] [place], [place] [address]     Blood Alcohol level:  Lab Results  Component Value Date   [facility] <10 02/16/2021   ETH <10 [id number]    Metabolic Disorder Labs:  Lab Results  Component Value Date   HGBA1C 5.8 08/26/2019   No results found for: PROLACTIN Lab Results  Component Value Date   CHOL 191 08/26/2019   TRIG 146.0 08/26/2019   HDL 48.40 08/26/2019   CHOLHDL 4 08/26/2019   VLDL 29.2 08/26/2019   LDLCALC 114 (H) 08/26/2019     Current Medications: Current Facility-Administered Medications  Medication Dose Route Frequency Provider Last Rate Last Admin   acetaminophen (TYLENOL) tablet 650 mg  650 mg Oral Q6H PRN Prescilla Sours, PA-C       alum & mag hydroxide-simeth (MAALOX/MYLANTA) 200-200-20 MG/5ML suspension 30 mL  30 mL Oral Q4H PRN Margorie John W, PA-C       hydrOXYzine (ATARAX) tablet 25 mg  25 mg Oral TID PRN Margorie John W, PA-C       magnesium hydroxide (MILK OF MAGNESIA) suspension 30 mL  30 mL Oral Daily PRN Margorie John W, PA-C       traZODone (DESYREL) tablet 50 mg  50 mg Oral QHS PRN Prescilla Sours, PA-C       PTA Medications: No medications prior to admission.    Musculoskeletal: Strength & Muscle Tone: within normal limits Gait & Station: normal Patient leans: N/A            Psychiatric Specialty Exam:  Presentation  General Appearance: Casual (wrapped in a blanket)  Eye Contact:Minimal  Speech:Clear and Coherent  Speech Volume:Decreased  Handedness:No data recorded  Mood and Affect  Mood:Depressed  Affect:Congruent   Thought Process  Thought Processes:Coherent  Duration of Psychotic Symptoms: No data recorded Past Diagnosis of Schizophrenia or Psychoactive disorder: No  Descriptions of Associations:Intact  Orientation:Full (Time, Place and Person)  Thought Content:Perseveration; Logical  Hallucinations:Hallucinations: None  Ideas of Reference:None  Suicidal Thoughts:Suicidal Thoughts: No  Homicidal Thoughts:Homicidal Thoughts: No   Sensorium  Memory:Immediate Good; Recent Good; Remote Good  Judgment:-- (Improving)  Insight:Shallow   Executive Functions  Concentration:Good  Attention Span:Fair  Recall:No data recorded Fund of Knowledge:Fair  Language:Fair   Psychomotor Activity  Psychomotor Activity:Psychomotor Activity: Normal   Assets  Assets:Communication Skills; Resilience   Sleep  Sleep:Sleep: Poor    Physical  Exam: Physical Exam HENT:     Head: Normocephalic and atraumatic.  Pulmonary:     Effort: Pulmonary effort is normal.  Neurological:     Mental Status: He is alert and oriented to person, place, and time.   Review of Systems  Psychiatric/Behavioral:  Positive for depression. Negative for suicidal ideas. The patient has insomnia.   There were no vitals taken for this visit. There is no height or weight on file to calculate BMI.  Treatment Plan Summary: Daily contact with patient to assess and evaluate symptoms and progress in treatment and Medication management  Labs Reviewed: Uds (+ Meth), CMP- WNL, EtOH (-), CBC (WNL) Salicylate (-), Acetaminophen (-)  Pending: A1c, Lipid Panel, TSH EKG: NSR, QTC- [place] is a 41 yo patient w/ MDD and substance use that endorsed SI on presentation to [facility]. Patient does appear  to meet criteria for MDD, but his psychotic symptoms that were endorsed in [facility] appear to have resolved. It is likely that patient's psychotic symptoms were 2/2 to his substance use, but will continue to monitor in patient. At this time patient would benefit from mood stabilization and assistance with his request to be sober from Meth use. Patient will be encouraged to participate in psychotherapy and integrate into the mileu.   MDD, recurrent, severe w/o psychotic features Recent psychotic symptoms- resolved - Start Zoloft 25mg  daily   PRN -Tylenol 650mg  q6h, pain -Maalox 67ml q4h, indigestion -Atarax 25mg  TID, anxiety -Milk of Mag 25mL, constipation -Trazodone 50mg  QHS, insomnia  Agitation protocol: Zyprexa 5mg  q8h, Ativan 1mg , Geodon 20mg   Stimulant use disorder, severe, amphetamine type substance - SW consult, patient appears to be ready to enter the  action stage of change and may consider Rehab  Physician Treatment Plan for Primary Diagnosis: MDD (major depressive disorder), recurrent, severe, with psychosis ([place]) Long Term Goal(s): Improvement in symptoms  so as ready for discharge  Short Term Goals: Ability to identify changes in lifestyle to reduce recurrence of condition will improve, Ability to verbalize feelings will improve, Ability to disclose and discuss suicidal ideas, Ability to demonstrate self-control will improve, Ability to identify and develop effective coping behaviors will improve, Ability to maintain clinical measurements within normal limits will improve, and Ability to identify triggers associated with substance abuse/mental health issues will improve  Physician Treatment Plan for Secondary Diagnosis: Principal Problem:   MDD (major depressive disorder), recurrent, severe, with psychosis ([place])  Long Term Goal(s): Improvement in symptoms so as ready for discharge  Short Term Goals: Ability to identify changes in lifestyle to reduce recurrence of condition will improve, Ability to verbalize feelings will improve, Ability to disclose and discuss suicidal ideas, Ability to demonstrate self-control will improve, Ability to identify and develop effective coping behaviors will improve, Ability to maintain clinical measurements within normal limits will improve, and Ability to identify triggers associated with substance abuse/mental health issues will improve  I certify that inpatient services furnished can reasonably be expected to improve the patient's condition.     PGY-2 Freida Busman, MD 1/12/20232:24 PM

## 2021-02-17 NOTE — ED Notes (Signed)
Report to Genevieve Norlander, RN at [facility].

## 2021-02-17 NOTE — Progress Notes (Signed)
[facility] Group Notes:  (Nursing/MHT/Case Management/Adjunct)  Date:  02/17/2021  Time:  2015 Type of Therapy:   wrap up group  Participation Level:  Active  Participation Quality:  Appropriate, Attentive, Sharing, and Supportive  Affect:  Appropriate  Cognitive:  Alert  Insight:  Improving  Engagement in Group:  Engaged  Modes of Intervention:  Clarification, Education, and Support  Summary of Progress/Problems: Positive thinking and positive change were discussed.   Andrew Parsons 02/17/2021, 9:22 PM

## 2021-02-17 NOTE — BH Assessment (Signed)
Clinician messaged Daun Peacock, RN: "Hey. It's Trey with TTS. Is the pt able to engage in the assessment, if so the pt will need to be placed in a private room. Also is the pt under IVC?"   Clinician awaiting response.    Redmond Pulling, MS, [facility], [facility] Triage Specialist [phone]

## 2021-02-17 NOTE — ED Notes (Signed)
Pt called x1 for vital signs with no response.

## 2021-02-17 NOTE — ED Provider Notes (Signed)
[facility] EMERGENCY DEPARTMENT Provider Note   CSN: [id number] Arrival date & time: 02/16/21  2122     History  Chief Complaint  Patient presents with   Suicidal    Andrew Parsons is a 41 y.o. male.  41 y/o male presents to the ED with c/o suicidal ideations.  Per triage note, "These been occurring intermittently over the past few weeks but have become more persistent.  Patient reports a plan to jump off a bridge.  Denies any HI.  Does report he has been having intermittent visual hallucinations.  Reports he has been having increasing issues with his significant other that have worsened his suicidal ideations.  Endorses using methamphetamine a few hours prior to arrival, denies other drug or alcohol use.  No medical complaints."  The history is provided by the patient and medical records. No language interpreter was used.      Home Medications Prior to Admission medications   Medication Sig Start Date End Date Taking? Authorizing Provider  risperiDONE (RISPERDAL M-TABS) 1 MG disintegrating tablet Take 1 tablet (1 mg total) by mouth 2 (two) times daily. 08/06/20   Charm Rings, NP      Allergies    Patient has no known allergies.    Review of Systems   Review of Systems Ten systems reviewed and are negative for acute change, except as noted in the HPI.    Physical Exam Updated Vital Signs BP (!) 153/96 (BP Location: Right Arm)    Pulse (!) 102    Temp 98.6 F (37 C) (Oral)    Resp 16    SpO2 97%  Physical Exam Vitals and nursing note reviewed.  Constitutional:      General: He is not in acute distress.    Appearance: He is well-developed. He is not diaphoretic.     Comments: Sleeping, but wakes easily to loud voice. Nontoxic appearing and in NAD.  HENT:     Head: Normocephalic and atraumatic.  Eyes:     General: No scleral icterus.    Conjunctiva/sclera: Conjunctivae normal.  Pulmonary:     Effort: Pulmonary effort is normal. No respiratory  distress.     Comments: Respirations even and unlabored Musculoskeletal:        General: Normal range of motion.     Cervical back: Normal range of motion.  Skin:    General: Skin is warm and dry.     Coloration: Skin is not pale.     Findings: No erythema or rash.  Neurological:     Mental Status: He is alert and oriented to person, place, and time.     Coordination: Coordination normal.  Psychiatric:        Behavior: Behavior is cooperative.        Thought Content: Thought content includes suicidal ideation. Thought content does not include homicidal ideation. Thought content includes suicidal plan.    ED Results / Procedures / Treatments   Labs (all labs ordered are listed, but only abnormal results are displayed) Labs Reviewed  RAPID URINE DRUG SCREEN, HOSP PERFORMED - Abnormal; Notable for the following components:      Result Value   Amphetamines POSITIVE (*)    All other components within normal limits  SALICYLATE LEVEL - Abnormal; Notable for the following components:   Salicylate Lvl <7.0 (*)    All other components within normal limits  ACETAMINOPHEN LEVEL - Abnormal; Notable for the following components:   Acetaminophen (Tylenol), Serum <10 (*)  All other components within normal limits  RESP PANEL BY RT-PCR (FLU A&B, COVID) ARPGX2  COMPREHENSIVE METABOLIC PANEL  ETHANOL  CBC WITH DIFFERENTIAL/PLATELET    EKG EKG Interpretation  Date/Time:  Wednesday February 16 2021 23:29:17 EST Ventricular Rate:  94 PR Interval:  140 QRS Duration: 96 QT Interval:  334 QTC Calculation: 417 R Axis:   109 Text Interpretation: Normal sinus rhythm Rightward axis Nonspecific ST abnormality Abnormal QRS-T angle, consider primary T wave abnormality Abnormal ECG When compared with ECG of 27-Jul-2015 23:22, No significant change was found Confirmed by Dione Booze ([id number]) on 02/17/2021 12:03:48 AM  Radiology No results found.  Procedures Procedures    Medications Ordered in  ED Medications - No data to display  ED Course/ Medical Decision Making/ A&P                           Medical Decision Making Amount and/or Complexity of Data Reviewed External Data Reviewed: notes. Labs: ordered. ECG/medicine tests: ordered and independent interpretation performed.   41 year old male presents to the emergency department complaining of suicidal ideations with plan to jump off a bridge.  Labs reviewed which are notable for amphetamines on UDS consistent with substance abuse.  No other acute laboratory abnormalities.  Patient medically cleared.  He has been evaluated by TTS who recommend transfer to [facility] for psychiatric admission. Appropriate for transfer after 0800.        Final Clinical Impression(s) / ED Diagnoses Final diagnoses:  Severe episode of recurrent major depressive disorder, with psychotic features (HCC)  Drug abuse, amphetamine type [facility])    Rx / DC Orders ED Discharge Orders     None         Antony Madura, PA-C 02/17/21 0603    Dione Booze, MD 02/17/21 [phone]

## 2021-02-17 NOTE — ED Notes (Signed)
Pt being assessed via TTS in room.

## 2021-02-17 NOTE — ED Notes (Signed)
[facility] returned call, Safe Transport to be dispatched.

## 2021-02-17 NOTE — ED Notes (Signed)
No signature pad within reach of pt.  Pt gives verbal consent for transfer to [facility].  Pt signs paper consent for admission to [facility].

## 2021-02-18 ENCOUNTER — Encounter (HOSPITAL_COMMUNITY): Payer: Self-pay

## 2021-02-18 MED ORDER — IBUPROFEN 400 MG PO TABS
400.0000 mg | ORAL_TABLET | Freq: Four times a day (QID) | ORAL | Status: DC | PRN
  Administered 2021-02-18: 400 mg via ORAL
  Filled 2021-02-18 (×2): qty 1

## 2021-02-18 MED ORDER — AMLODIPINE BESYLATE 5 MG PO TABS
5.0000 mg | ORAL_TABLET | Freq: Every day | ORAL | Status: DC
  Administered 2021-02-18 – 2021-02-23 (×6): 5 mg via ORAL
  Filled 2021-02-18 (×4): qty 1
  Filled 2021-02-18: qty 14
  Filled 2021-02-18 (×5): qty 1

## 2021-02-18 MED ORDER — FLUTICASONE PROPIONATE 50 MCG/ACT NA SUSP
1.0000 | Freq: Every day | NASAL | Status: DC
  Administered 2021-02-18 – 2021-02-23 (×5): 1 via NASAL
  Filled 2021-02-18 (×2): qty 16

## 2021-02-18 NOTE — Progress Notes (Signed)
°   02/18/21 0000  Psych Admission Type (Psych Patients Only)  Admission Status Voluntary  Psychosocial Assessment  Patient Complaints Anxiety;Depression  Eye Contact Avertive  Facial Expression Sad  Affect Depressed  Speech Logical/coherent  Interaction Cautious  Motor Activity Slow  Appearance/Hygiene Disheveled;In scrubs  Behavior Characteristics Anxious  Mood Depressed  Thought Process  Coherency WDL  Content Blaming self  Delusions WDL  Perception Hallucinations  Hallucination Visual  Judgment Impaired  Confusion WDL  Danger to Self  Current suicidal ideation? Active  Self-Injurious Behavior No self-injurious ideation or behavior indicators observed or expressed   Agreement Not to Harm Self Yes  Description of Agreement verbal contract   Patient compliant with medications denies SI/HI/A/VH at present and verbally contracted for safety. Support and encouragement provided.

## 2021-02-18 NOTE — [facility] Group Notes (Signed)
[place] Group Notes:  (Nursing/MHT/Case Management/Adjunct)  Date:  02/18/2021  Time:  11:59 AM  Type of Therapy:  Psychoeducational Skills  Participation Level:  Did Not Attend  Participation Quality:      Affect:      Cognitive:      Insight:  None  Engagement in Group:      Modes of Intervention:  Education  Summary of Progress/Problems: Patient did not attend.  Jerrye Beavers 02/18/2021, 11:59 AM

## 2021-02-18 NOTE — BH IP Treatment Plan (Signed)
Interdisciplinary Treatment and Diagnostic Plan Update  02/18/2021 Andrew Parsons MRN: [id number]  Principal Diagnosis: MDD (major depressive disorder), recurrent, severe, with psychosis ([place])  Secondary Diagnoses: Principal Problem:   MDD (major depressive disorder), recurrent, severe, with psychosis ([place]) Active Problems:   Stimulant use disorder   Current Medications:  Current Facility-Administered Medications  Medication Dose Route Frequency Provider Last Rate Last Admin   acetaminophen (TYLENOL) tablet 650 mg  650 mg Oral Q6H PRN Prescilla Sours, PA-C   650 mg at 02/17/21 2107   alum & mag hydroxide-simeth (MAALOX/MYLANTA) 200-200-20 MG/5ML suspension 30 mL  30 mL Oral Q4H PRN Margorie John W, PA-C       fluticasone (FLONASE) 50 MCG/ACT nasal spray 1 spray  1 spray Each Nare Daily Massengill, Nathan, MD       hydrOXYzine (ATARAX) tablet 25 mg  25 mg Oral TID PRN Prescilla Sours, PA-C   25 mg at 02/17/21 2108   ibuprofen (ADVIL) tablet 400 mg  400 mg Oral Q6H PRN Massengill, Ovid Curd, MD       OLANZapine zydis (ZYPREXA) disintegrating tablet 5 mg  5 mg Oral Q8H PRN Freida Busman, MD       And   LORazepam (ATIVAN) tablet 1 mg  1 mg Oral PRN Freida Busman, MD       And   ziprasidone (GEODON) injection 20 mg  20 mg Intramuscular PRN Damita Dunnings B, MD       magnesium hydroxide (MILK OF MAGNESIA) suspension 30 mL  30 mL Oral Daily PRN Lovena Le, Cody W, PA-C       sertraline (ZOLOFT) tablet 50 mg  50 mg Oral Daily Massengill, Ovid Curd, MD   50 mg at 02/18/21 0754   traZODone (DESYREL) tablet 50 mg  50 mg Oral QHS PRN Prescilla Sours, PA-C       PTA Medications: No medications prior to admission.    Patient Stressors: Marital or family conflict   Occupational concerns   Substance abuse    Patient Strengths: Ability for insight  Average or above average intelligence  Motivation for treatment/growth   Treatment Modalities: Medication Management, Group therapy, Case management,  1 to 1  session with clinician, Psychoeducation, Recreational therapy.   Physician Treatment Plan for Primary Diagnosis: MDD (major depressive disorder), recurrent, severe, with psychosis ([place]) Long Term Goal(s): Improvement in symptoms so as ready for discharge   Short Term Goals: Ability to identify changes in lifestyle to reduce recurrence of condition will improve Ability to verbalize feelings will improve Ability to disclose and discuss suicidal ideas Ability to demonstrate self-control will improve Ability to identify and develop effective coping behaviors will improve Ability to maintain clinical measurements within normal limits will improve Ability to identify triggers associated with substance abuse/mental health issues will improve  Medication Management: Evaluate patient's response, side effects, and tolerance of medication regimen.  Therapeutic Interventions: 1 to 1 sessions, Unit Group sessions and Medication administration.  Evaluation of Outcomes: Not Met  Physician Treatment Plan for Secondary Diagnosis: Principal Problem:   MDD (major depressive disorder), recurrent, severe, with psychosis ([place]) Active Problems:   Stimulant use disorder  Long Term Goal(s): Improvement in symptoms so as ready for discharge   Short Term Goals: Ability to identify changes in lifestyle to reduce recurrence of condition will improve Ability to verbalize feelings will improve Ability to disclose and discuss suicidal ideas Ability to demonstrate self-control will improve Ability to identify and develop effective coping behaviors will improve Ability to  maintain clinical measurements within normal limits will improve Ability to identify triggers associated with substance abuse/mental health issues will improve     Medication Management: Evaluate patient's response, side effects, and tolerance of medication regimen.  Therapeutic Interventions: 1 to 1 sessions, Unit Group sessions and Medication  administration.  Evaluation of Outcomes: Not Met   RN Treatment Plan for Primary Diagnosis: MDD (major depressive disorder), recurrent, severe, with psychosis ([place]) Long Term Goal(s): Knowledge of disease and therapeutic regimen to maintain health will improve  Short Term Goals: Ability to participate in decision making will improve, Ability to identify and develop effective coping behaviors will improve, and Compliance with prescribed medications will improve  Medication Management: RN will administer medications as ordered by provider, will assess and evaluate patient's response and provide education to patient for prescribed medication. RN will report any adverse and/or side effects to prescribing provider.  Therapeutic Interventions: 1 on 1 counseling sessions, Psychoeducation, Medication administration, Evaluate responses to treatment, Monitor vital signs and CBGs as ordered, Perform/monitor CIWA, COWS, AIMS and Fall Risk screenings as ordered, Perform wound care treatments as ordered.  Evaluation of Outcomes: Not Met   LCSW Treatment Plan for Primary Diagnosis: MDD (major depressive disorder), recurrent, severe, with psychosis ([place]) Long Term Goal(s): Safe transition to appropriate next level of care at discharge, Engage patient in therapeutic group addressing interpersonal concerns.  Short Term Goals: Engage patient in aftercare planning with referrals and resources, Increase ability to appropriately verbalize feelings, and Increase emotional regulation  Therapeutic Interventions: Assess for all discharge needs, 1 to 1 time with Social worker, Explore available resources and support systems, Assess for adequacy in community support network, Educate family and significant other(s) on suicide prevention, Complete Psychosocial Assessment, Interpersonal group therapy.  Evaluation of Outcomes: Not Met   Progress in Treatment: Attending groups: Yes. Participating in groups: Yes. Taking  medication as prescribed: Yes. Toleration medication: Yes. Family/Significant other contact made: No, will contact:  CSW will obtain consents Patient understands diagnosis: Yes. Discussing patient identified problems/goals with staff: Yes. Medical problems stabilized or resolved: Yes. Denies suicidal/homicidal ideation: Yes. Issues/concerns per patient self-inventory: No. Other: None  New problem(s) identified: No, Describe:  None  New Short Term/Long Term Goal(s):medication stabilization, elimination of SI thoughts, development of comprehensive mental wellness plan.   Patient Goals:  Did Not Attend  Discharge Plan or Barriers: Patient recently admitted. CSW will continue to follow and assess for appropriate referrals and possible discharge planning.   Reason for Continuation of Hospitalization: Medication stabilization Suicidal ideation Withdrawal symptoms  Estimated Length of Stay: 3-5 days   Scribe for Treatment Team: Eliott Nine 02/18/2021 2:08 PM

## 2021-02-18 NOTE — Progress Notes (Signed)
°   02/18/21 2159  Psych Admission Type (Psych Patients Only)  Admission Status Voluntary  Psychosocial Assessment  Patient Complaints Anxiety  Eye Contact Brief  Facial Expression Flat  Affect Appropriate to circumstance  Speech Logical/coherent  Interaction Cautious;Guarded  Motor Activity Other (Comment) (WDL)  Appearance/Hygiene In scrubs  Behavior Characteristics Appropriate to situation  Mood Anxious  Thought Process  Coherency Circumstantial  Content WDL  Delusions None reported or observed  Perception WDL  Hallucination None reported or observed  Judgment Impaired  Confusion None  Danger to Self  Current suicidal ideation? Denies  Self-Injurious Behavior No self-injurious ideation or behavior indicators observed or expressed   Agreement Not to Harm Self Yes  Description of Agreement verbal contract  Danger to Others  Danger to Others None reported or observed

## 2021-02-18 NOTE — Progress Notes (Signed)
Patient did not attend wrap up group. 

## 2021-02-18 NOTE — Group Note (Signed)
[facility] LCSW Group Therapy Note ° ° °Group Date: 02/18/2021 °Start Time: 1300 °End Time: 1400 ° ° °Type of Therapy/Topic:  Group Therapy:  Balance in Life ° °Participation Level:  Active  ° °Description of Group:   ° This group will address the concept of balance and how it feels and looks when one is unbalanced. Patients will be encouraged to process areas in their lives that are out of balance, and identify reasons for remaining unbalanced. Facilitators will guide patients utilizing problem- solving interventions to address and correct the stressor making their life unbalanced. Understanding and applying boundaries will be explored and addressed for obtaining  and maintaining a balanced life. Patients will be encouraged to explore ways to assertively make their unbalanced needs known to significant others in their lives, using other group members and facilitator for support and feedback. ° °Therapeutic Goals: °Patient will identify two or more emotions or situations they have that consume much of in their lives. °Patient will identify signs/triggers that life has become out of balance:  °Patient will identify two ways to set boundaries in order to achieve balance in their lives:  °Patient will demonstrate ability to communicate their needs through discussion and/or role plays ° °Summary of Patient Progress: ° ° ° °Pt participated in group and remained there the entire time.  The Pt accepted the worksheet that was provided and followed along throughout the group session.  The Pt discussed things that are in their control and out of their control.  The Pt was appropriate and maintained a positive mood.  ° ° ° °Therapeutic Modalities:   °Cognitive Behavioral Therapy °Solution-Focused Therapy °Assertiveness Training ° ° °Aadon Gorelik M Hollynn Garno, LCSWA °

## 2021-02-18 NOTE — Group Note (Signed)
Recreation Therapy Group Note   Group Topic:Stress Management  Group Date: 02/18/2021 Start Time: 0930 End Time: 0945 Facilitators: Victorino Sparrow, LRT,CTRS Location: [address] Dayroom   Goal Area(s) Addresses:  Patient will actively participate in stress management techniques presented during session.  Patient will successfully identify benefit of practicing stress management post d/c.   Group Description: Guided Imagery. LRT provided education, instruction, and demonstration on practice of visualization via guided imagery. Patient was asked to participate in the technique introduced during session. LRT debriefed including topics of mindfulness, stress management and specific scenarios each patient could use these techniques. Patients were given suggestions of ways to access scripts post d/c and encouraged to explore Youtube and other apps available on smartphones, tablets, and computers.   Affect/Mood: Appropriate   Participation Level: Active   Participation Quality: Independent   Behavior: Appropriate   Speech/Thought Process: Focused   Insight: Good   Judgement: Good   Modes of Intervention: Script   Patient Response to Interventions:  Attentive   Education Outcome:  Acknowledges education and In group clarification offered    Clinical Observations/Individualized Feedback: Pt attended and participated in group.    Plan: Continue to engage patient in RT group sessions 2-3x/week.   Victorino Sparrow, LRT,CTRS 02/18/2021 10:56 AM

## 2021-02-18 NOTE — Group Note (Signed)
Date:  02/18/2021 Time:  4:23 PM  Group Topic/Focus:  Orientation:   The focus of this group is to educate the patient on the purpose and policies of crisis stabilization and provide a format to answer questions about their admission.  The group details unit policies and expectations of patients while admitted.    Participation Level:  Minimal  Participation Quality:  Inattentive  Affect:  Appropriate  Cognitive:  Appropriate  Insight: Appropriate  Engagement in Group:  Improving  Modes of Intervention:  Discussion  Additional Comments:    Jerrye Beavers 02/18/2021, 4:23 PM

## 2021-02-18 NOTE — Progress Notes (Addendum)
[facility] Progress Note  02/18/2021 4:56 PM Andrew Parsons  MRN:  [id number] Subjective:  Andrew Parsons is a 41 yo patient with no PPH documented in his EMR who presented to [facility] voluntarily endorsing SI and was transferred to [facility].   Case was discussed in the multidisciplinary team. MAR was reviewed and patient was compliant with medications.  He did not require any PRN's for agitation.     Psychiatric Team made the following recommendations yesterday:  - Start Zoloft 25mg  daily     PRN -Tylenol 650mg  q6h, pain -Maalox 83ml q4h, indigestion -Atarax 25mg  TID, anxiety -Milk of Mag 42mL, constipation -Trazodone 50mg  QHS, insomnia  Agitation protocol: Zyprexa 5mg  q8h, Ativan 1mg , Geodon 20mg   On assessment today patient reports that he slept very well overnight. Patient reports that he is still feeling very sad today and endorses that he has had slight headaches since starting Zoloft. Patient reports that he feels more "emotional." Patient reports that he is trying to talk to other patient's but he is reluctant to talk about his feelings as he is still a bit nervous about this. Patient reports that he feels that he is still "thinking too much." Patient reports that he is still worried about what will happen over the next few days and how he can improve himself. Patient reports that he does not want to continue his substance use and is very serious in his intentions to stop. Patient reports he is open to rehab. He denies having suicidal thoughts. Denies HI. Denies AH, VH, and paranoia.    Patient also endorses that he is feeling congested.   Objectively, patient appears to have psychomotor retardation and is still tearful occasionally on exam. Patient has been noted to attempt to interact with others on the unit.  Principal Problem: MDD (major depressive disorder), recurrent, severe, with psychosis ([place]) Diagnosis: Principal Problem:   MDD (major depressive disorder), recurrent, severe, with psychosis  ([place]) Active Problems:   Stimulant use disorder  Total Time spent with patient: 20 minutes  Past Psychiatric History:  See H&P  Past Medical History:  Past Medical History:  Diagnosis Date   Alcohol abuse    Anxiety 08/26/2019   Atypical syncope 08/26/2019   Depression    Fainting spell    Fatigue 08/26/2019   Hyperlipidemia    Hypertension    Polysubstance (excluding opioids) dependence ([place]) 08/26/2019   Sleep disturbance 08/26/2019    Past Surgical History:  Procedure Laterality Date   TYMPANOPLASTY     Family History:  Family History  Problem Relation Age of Onset   Early death Mother    Stroke Mother    Depression Daughter    Hyperlipidemia Daughter    Family Psychiatric  History: See H&P Social History:  Social History   Substance and Sexual Activity  Alcohol Use Yes   Comment: not now but has abused it the past and episodic binge      Social History   Substance and Sexual Activity  Drug Use Not Currently   Types: Methamphetamines, Cocaine, Marijuana   Comment: Substance use since 41 yo     Social History   Socioeconomic History   Marital status: Single    Spouse name: Not on file   Number of children: Not on file   Years of education: Not on file   Highest education level: GED or equivalent  Occupational History   Occupation: Printmaker   Tobacco Use   Smoking status: Every Day    Packs/day:  0.50    Types: Cigarettes   Smokeless tobacco: Never  Vaping Use   Vaping Use: Never used  Substance and Sexual Activity   Alcohol use: Yes    Comment: not now but has abused it the past and episodic binge    Drug use: Not Currently    Types: Methamphetamines, Cocaine, Marijuana    Comment: Substance use since 41 yo    Sexual activity: Yes  Other Topics Concern   Not on file  Social History Narrative   Lives with a friend . He has 2 children and 2 grandkids .    Social Determinants of Health   Financial Resource Strain: Not on file  Food  Insecurity: Not on file  Transportation Needs: Not on file  Physical Activity: Not on file  Stress: Not on file  Social Connections: Not on file   Additional Social History:                         Sleep: Good  Appetite:  Good  Current Medications: Current Facility-Administered Medications  Medication Dose Route Frequency Provider Last Rate Last Admin   acetaminophen (TYLENOL) tablet 650 mg  650 mg Oral Q6H PRN Andrew Sours, PA-C   650 mg at 02/17/21 2107   alum & mag hydroxide-simeth (MAALOX/MYLANTA) 200-200-20 MG/5ML suspension 30 mL  30 mL Oral Q4H PRN Andrew John W, PA-C       fluticasone (FLONASE) 50 MCG/ACT nasal spray 1 spray  1 spray Each Nare Daily Andrew Parsons, Andrew Curd, MD   1 spray at 02/18/21 1517   hydrOXYzine (ATARAX) tablet 25 mg  25 mg Oral TID PRN Andrew Sours, PA-C   25 mg at 02/17/21 2108   ibuprofen (ADVIL) tablet 400 mg  400 mg Oral Q6H PRN Andrew Limbo, MD   400 mg at 02/18/21 1514   OLANZapine zydis (ZYPREXA) disintegrating tablet 5 mg  5 mg Oral Q8H PRN Andrew Busman, MD       And   LORazepam (ATIVAN) tablet 1 mg  1 mg Oral PRN Andrew Busman, MD       And   ziprasidone (GEODON) injection 20 mg  20 mg Intramuscular PRN Andrew Busman, MD       magnesium hydroxide (MILK OF MAGNESIA) suspension 30 mL  30 mL Oral Daily PRN Lovena Le, Andrew W, PA-C       sertraline (ZOLOFT) tablet 50 mg  50 mg Oral Daily Andrew Parsons, Andrew Curd, MD   50 mg at 02/18/21 0754   traZODone (DESYREL) tablet 50 mg  50 mg Oral QHS PRN Andrew Sours, PA-C        Lab Results:  Results for orders placed or performed during the hospital encounter of 02/17/21 (from the past 48 hour(s))  Hemoglobin A1c     Status: Abnormal   Collection Time: 02/17/21  6:48 PM  Result Value Ref Range   Hgb A1c MFr Bld 5.7 (H) 4.8 - 5.6 %    Comment: (NOTE) Pre diabetes:          5.7%-6.4%  Diabetes:              >6.4%  Glycemic control for   <7.0% adults with diabetes    Mean Plasma  Glucose 116.89 mg/dL    Comment: Performed at [facility], [address], [place], [place] [address]  Lipid panel     Status: Abnormal   Collection Time: 02/17/21  6:48 PM  Result Value Ref Range   Cholesterol 163 0 - 200 mg/dL   Triglycerides 478 (H) <150 mg/dL   HDL 40 (L) >29 mg/dL   Total CHOL/HDL Ratio 4.1 RATIO   VLDL 37 0 - 40 mg/dL   LDL Cholesterol 86 0 - 99 mg/dL    Comment:        Total Cholesterol/HDL:CHD Risk Coronary Heart Disease Risk Table                     Men   Women  1/2 Average Risk   3.4   3.3  Average Risk       5.0   4.4  2 X Average Risk   9.6   7.1  3 X Average Risk  23.4   11.0        Use the calculated Patient Ratio above and the CHD Risk Table to determine the patient's CHD Risk.        ATP III CLASSIFICATION (LDL):  <100     mg/dL   Optimal  562-130  mg/dL   Near or Above                    Optimal  130-159  mg/dL   Borderline  865-784  mg/dL   High  >696     mg/dL   Very High Performed at [facility], [address], [place], [place] [address]   TSH     Status: None   Collection Time: 02/17/21  6:48 PM  Result Value Ref Range   TSH 1.011 0.350 - 4.500 uIU/mL    Comment: Performed by a 3rd Generation assay with a functional sensitivity of <=0.01 uIU/mL. Performed at [facility], [address], [place], [place] [address]     Blood Alcohol level:  Lab Results  Component Value Date   ETH <10 02/16/2021   ETH <10 08/05/2020    Metabolic Disorder Labs: Lab Results  Component Value Date   HGBA1C 5.7 (H) 02/17/2021   MPG 116.89 02/17/2021   No results found for: PROLACTIN Lab Results  Component Value Date   CHOL 163 02/17/2021   TRIG 183 (H) 02/17/2021   HDL 40 (L) 02/17/2021   CHOLHDL 4.1 02/17/2021   VLDL 37 02/17/2021   LDLCALC 86 02/17/2021   LDLCALC 114 (H) 08/26/2019    Physical Findings: AIMS: Facial and Oral Movements Muscles of Facial Expression: None, normal Lips  and Perioral Area: None, normal Jaw: None, normal Tongue: None, normal,Extremity Movements Upper (arms, wrists, hands, fingers): None, normal Lower (legs, knees, ankles, toes): None, normal, Trunk Movements Neck, shoulders, hips: None, normal, Overall Severity Severity of abnormal movements (highest score from questions above): None, normal Incapacitation due to abnormal movements: None, normal Patient's awareness of abnormal movements (rate only patient's report): No Awareness, Dental Status Current problems with teeth and/or dentures?: No Does patient usually wear dentures?: No  CIWA:    COWS:     Musculoskeletal: Strength & Muscle Tone: within normal limits Gait & Station: normal Patient leans: N/A  Psychiatric Specialty Exam:  Presentation  General Appearance: Appropriate for Environment  Eye Contact:Minimal  Speech:Clear and Coherent  Speech Volume:Decreased  Handedness:No data recorded  Mood and Affect  Mood:Dysphoric  Affect:Tearful   Thought Process  Thought Processes:Coherent  Descriptions of Associations:Intact  Orientation:Full (Time, Place and Person)  Thought Content:Logical  History of Schizophrenia/Schizoaffective disorder:No  Duration of Psychotic Symptoms:No data recorded Hallucinations:Hallucinations: None  Ideas of  Reference:None  Suicidal Thoughts:Suicidal Thoughts: No  Homicidal Thoughts:Homicidal Thoughts: No   Sensorium  Memory:Immediate Good; Recent Good; Remote Good  Judgment:-- (IMproving)  Insight:-- (Improving)   Executive Functions  Concentration:Fair  Attention Span:Fair  Recall:No data recorded Fund of Knowledge:Fair  Language:Good   Psychomotor Activity  Psychomotor Activity:Psychomotor Activity: Psychomotor Retardation   Assets  Assets:Communication Skills; Resilience   Sleep  Sleep:Sleep: Good    Physical Exam: Physical Exam HENT:     Head: Normocephalic and atraumatic.  Pulmonary:      Effort: Pulmonary effort is normal.  Neurological:     Mental Status: He is alert and oriented to person, place, and time.   Review of Systems  HENT:  Positive for congestion.   Respiratory:  Positive for cough.   Psychiatric/Behavioral:  Positive for depression. Negative for hallucinations and suicidal ideas. The patient does not have insomnia.    Blood pressure (!) 156/97, pulse 80, temperature 98 F (36.7 C), temperature source Oral, resp. rate 16, height 5\' 11"  (1.803 m), weight 91.8 kg, SpO2 99 %. Body mass index is 28.23 kg/m.   Treatment Plan Summary: Daily contact with patient to assess and evaluate symptoms and progress in treatment and Medication management Andrew Parsons is a 41 yo patient Parsons/ MDD and substance use that endorsed SI on presentation to [facility]. Patient does appear a bit more dysphoric but this may be 2/2 to his cessation of crystal meth. Patient did endorse 20 years of frequent meth use and his probable dopamine upregulation  has led to current dysregulation of receptors due to having previous tolerance. Hopefully with time if patient remains sober his D2-r will become more sensitive and he will only continue to do well on Zoloft. Patient's headaches may be from medication but could also be 2/2 to his congestion.  MDD, recurrent, severe Parsons/o psychotic features Recent psychotic symptoms- resolved - Increase Zoloft to 50mg  daily  Congestion - Start Flonase daily    PRN -Tylenol 650mg  q6h, pain - Advil 400mg  q6h  -Maalox 28ml q4h, indigestion -Atarax 25mg  TID, anxiety -Milk of Mag 90mL, constipation -Trazodone 50mg  QHS, insomnia  Agitation protocol: Zyprexa 5mg  q8h, Ativan 1mg , Geodon 20mg    Stimulant use disorder, severe, amphetamine type substance - SW consult, patient appears to be ready to enter the  action stage of change and may consider Rehab  PGY-2 Andrew Busman, MD 02/18/2021, 4:56 PM  Total Time Spent in Direct Patient Care:  I personally spent 30  minutes on the unit in direct patient care. The direct patient care time included face-to-face time with the patient, reviewing the patient's chart, communicating with other professionals, and coordinating care. Greater than 50% of this time was spent in counseling or coordinating care with the patient regarding goals of hospitalization, psycho-education, and discharge planning needs.  I have independently evaluated the patient during a face-to-face assessment on 02/18/21. I reviewed the patient's chart, and I participated in key portions of the service. I discussed the case with the [employer], and I agree with the assessment and plan of care as documented in the House Officer's note, as addended by me or notated below:  I directly edited the note, as above.  Addition to resident plan: HTN: -Start amlodipine 5 mg once daily   Andrew Limbo, MD Psychiatrist

## 2021-02-19 LAB — RESP PANEL BY RT-PCR (FLU A&B, COVID) ARPGX2
Influenza A by PCR: NEGATIVE
Influenza B by PCR: NEGATIVE
SARS Coronavirus 2 by RT PCR: NEGATIVE

## 2021-02-19 MED ORDER — DM-GUAIFENESIN ER 30-600 MG PO TB12
1.0000 | ORAL_TABLET | Freq: Two times a day (BID) | ORAL | Status: DC
  Administered 2021-02-19 – 2021-02-23 (×8): 1 via ORAL
  Filled 2021-02-19 (×10): qty 1

## 2021-02-19 NOTE — Progress Notes (Signed)
Adult Psychoeducational Group Note  Date:  02/19/2021 Time:  9:34 AM  Group Topic/Focus:  Goals Group:   The focus of this group is to help patients establish daily goals to achieve during treatment and discuss how the patient can incorporate goal setting into their daily lives to aide in recovery.  Participation Level:  Minimal  Participation Quality:  Appropriate  Affect:  Appropriate  Cognitive:  Appropriate  Insight: Appropriate  Engagement in Group:  Engaged  Modes of Intervention:  Discussion  Additional Comments:  Pt stated his goal for the day is to get through the day.  Wynema Birch D 02/19/2021, 9:34 AM

## 2021-02-19 NOTE — Progress Notes (Signed)
[facility] MD Progress Note  02/19/2021 3:47 PM Andrew Parsons  MRN:  [id number] Subjective:    Andrew Parsons is a 41 yo patient with no PPH documented in his EMR who presented to [facility] voluntarily endorsing SI and was transferred to [facility].    Case was discussed in the multidisciplinary team. MAR was reviewed and patient was compliant with medications.  He did not require any PRN's for agitation.     Psychiatric Team made the following recommendations yesterday:  - Increase Zoloft to 50mg  daily -  Start Flonase daily  Patient noted to be wrapped in his blanket falling asleep in group. On assessment patient report he is not feeling well today. Patient reports that he is still having congestion, but is now also having chills and continues to have a headache. Patient reports that he would like to rest more today. Patient reports emotionally he still feels alone and "it doesn't feel good." Patient endorses continued hopelessness. Patient denies SI, HI, and AVH and does not appear more emotional than yesterday. Patient was not tearful on assessment today.  Principal Problem: MDD (major depressive disorder), recurrent, severe, with psychosis ([place]) Diagnosis: Principal Problem:   MDD (major depressive disorder), recurrent, severe, with psychosis ([place]) Active Problems:   Stimulant use disorder  Total Time spent with patient: 15 minutes  Past Psychiatric History: See H&P  Past Medical History:  Past Medical History:  Diagnosis Date   Alcohol abuse    Anxiety 08/26/2019   Atypical syncope 08/26/2019   Depression    Fainting spell    Fatigue 08/26/2019   Hyperlipidemia    Hypertension    Polysubstance (excluding opioids) dependence ([place]) 08/26/2019   Sleep disturbance 08/26/2019    Past Surgical History:  Procedure Laterality Date   TYMPANOPLASTY     Family History:  Family History  Problem Relation Age of Onset   Early death Mother    Stroke Mother    Depression Daughter    Hyperlipidemia Daughter     Family Psychiatric  History:  See H&P Social History:  Social History   Substance and Sexual Activity  Alcohol Use Yes   Comment: not now but has abused it the past and episodic binge      Social History   Substance and Sexual Activity  Drug Use Not Currently   Types: Methamphetamines, Cocaine, Marijuana   Comment: Substance use since 41 yo     Social History   Socioeconomic History   Marital status: Single    Spouse name: Not on file   Number of children: Not on file   Years of education: Not on file   Highest education level: GED or equivalent  Occupational History   Occupation: Printmaker   Tobacco Use   Smoking status: Every Day    Packs/day: 0.50    Types: Cigarettes   Smokeless tobacco: Never  Vaping Use   Vaping Use: Never used  Substance and Sexual Activity   Alcohol use: Yes    Comment: not now but has abused it the past and episodic binge    Drug use: Not Currently    Types: Methamphetamines, Cocaine, Marijuana    Comment: Substance use since 41 yo    Sexual activity: Yes  Other Topics Concern   Not on file  Social History Narrative   Lives with a friend . He has 2 children and 2 grandkids .    Social Determinants of Health   Financial Resource Strain: Not on file  Food Insecurity:  Not on file  Transportation Needs: Not on file  Physical Activity: Not on file  Stress: Not on file  Social Connections: Not on file   Additional Social History:                         Sleep: Fair  Appetite:  Fair  Current Medications: Current Facility-Administered Medications  Medication Dose Route Frequency Provider Last Rate Last Admin   acetaminophen (TYLENOL) tablet 650 mg  650 mg Oral Q6H PRN Prescilla Sours, PA-C   650 mg at 02/17/21 2107   alum & mag hydroxide-simeth (MAALOX/MYLANTA) 200-200-20 MG/5ML suspension 30 mL  30 mL Oral Q4H PRN Lovena Le, Cody W, PA-C       amLODipine (NORVASC) tablet 5 mg  5 mg Oral Daily Massengill, Ovid Curd, MD    5 mg at 02/19/21 [id number]   dextromethorphan-guaiFENesin ([place] DM) 30-600 MG per 12 hr tablet 1 tablet  1 tablet Oral BID Freida Busman, MD       fluticasone (FLONASE) 50 MCG/ACT nasal spray 1 spray  1 spray Each Nare Daily Massengill, Ovid Curd, MD   1 spray at 02/19/21 0749   hydrOXYzine (ATARAX) tablet 25 mg  25 mg Oral TID PRN Prescilla Sours, PA-C   25 mg at 02/17/21 2108   ibuprofen (ADVIL) tablet 400 mg  400 mg Oral Q6H PRN Janine Limbo, MD   400 mg at 02/18/21 1514   OLANZapine zydis (ZYPREXA) disintegrating tablet 5 mg  5 mg Oral Q8H PRN Freida Busman, MD       And   LORazepam (ATIVAN) tablet 1 mg  1 mg Oral PRN Freida Busman, MD       And   ziprasidone (GEODON) injection 20 mg  20 mg Intramuscular PRN Damita Dunnings B, MD       magnesium hydroxide (MILK OF MAGNESIA) suspension 30 mL  30 mL Oral Daily PRN Lovena Le, Cody W, PA-C       sertraline (ZOLOFT) tablet 50 mg  50 mg Oral Daily Massengill, Ovid Curd, MD   50 mg at 02/19/21 0748   traZODone (DESYREL) tablet 50 mg  50 mg Oral QHS PRN Prescilla Sours, PA-C        Lab Results:  Results for orders placed or performed during the hospital encounter of 02/17/21 (from the past 48 hour(s))  Hemoglobin A1c     Status: Abnormal   Collection Time: 02/17/21  6:48 PM  Result Value Ref Range   Hgb A1c MFr Bld 5.7 (H) 4.8 - 5.6 %    Comment: (NOTE) Pre diabetes:          5.7%-6.4%  Diabetes:              >6.4%  Glycemic control for   <7.0% adults with diabetes    Mean Plasma Glucose 116.89 mg/dL    Comment: Performed at [facility], [place] [address], [place], [place] [address]  Lipid panel     Status: Abnormal   Collection Time: 02/17/21  6:48 PM  Result Value Ref Range   Cholesterol 163 0 - 200 mg/dL   Triglycerides 183 (H) <150 mg/dL   HDL 40 (L) >40 mg/dL   Total CHOL/HDL Ratio 4.1 RATIO   VLDL 37 0 - 40 mg/dL   LDL Cholesterol 86 0 - 99 mg/dL    Comment:        Total Cholesterol/HDL:CHD Risk Coronary Heart Disease  Risk Table  Men   Women  1/2 Average Risk   3.4   3.3  Average Risk       5.0   4.4  2 X Average Risk   9.6   7.1  3 X Average Risk  23.4   11.0        Use the calculated Patient Ratio above and the CHD Risk Table to determine the patient's CHD Risk.        ATP III CLASSIFICATION (LDL):  <100     mg/dL   Optimal  100-129  mg/dL   Near or Above                    Optimal  130-159  mg/dL   Borderline  160-189  mg/dL   High  >190     mg/dL   Very High Performed at [facility] [address] [place], [place] [address]   TSH     Status: None   Collection Time: 02/17/21  6:48 PM  Result Value Ref Range   TSH 1.011 0.350 - 4.500 uIU/mL    Comment: Performed by a 3rd Generation assay with a functional sensitivity of <=0.01 uIU/mL. Performed at [facility], [place] [address], [place], [place] [address]   Resp Panel by RT-PCR (Flu A&B, Covid) Nasopharyngeal Swab     Status: None   Collection Time: 02/19/21 10:13 AM   Specimen: Nasopharyngeal Swab; Nasopharyngeal(NP) swabs in vial transport medium  Result Value Ref Range   SARS Coronavirus 2 by RT PCR NEGATIVE NEGATIVE    Comment: (NOTE) SARS-CoV-2 target nucleic acids are NOT DETECTED.  The SARS-CoV-2 RNA is generally detectable in upper respiratory specimens during the acute phase of infection. The lowest concentration of SARS-CoV-2 viral copies this assay can detect is 138 copies/mL. A negative result does not preclude SARS-Cov-2 infection and should not be used as the sole basis for treatment or other patient management decisions. A negative result may occur with  improper specimen collection/handling, submission of specimen other than nasopharyngeal swab, presence of viral mutation(s) within the areas targeted by this assay, and inadequate number of viral copies(<138 copies/mL). A negative result must be combined with clinical observations, patient history, and  epidemiological information. The expected result is Negative.  Fact Sheet for Patients:  EntrepreneurPulse.com.au  Fact Sheet for Healthcare Providers:  IncredibleEmployment.be  This test is no t yet approved or cleared by the [place] FDA and  has been authorized for detection and/or diagnosis of SARS-CoV-2 by FDA under an Emergency Use Authorization (EUA). This EUA will remain  in effect (meaning this test can be used) for the duration of the COVID-19 declaration under Section 564(b)(1) of the Act, 21 U.S.C.section 360bbb-3(b)(1), unless the authorization is terminated  or revoked sooner.       Influenza A by PCR NEGATIVE NEGATIVE   Influenza B by PCR NEGATIVE NEGATIVE    Comment: (NOTE) The Xpert Xpress SARS-CoV-2/FLU/RSV plus assay is intended as an aid in the diagnosis of influenza from Nasopharyngeal swab specimens and should not be used as a sole basis for treatment. Nasal washings and aspirates are unacceptable for Xpert Xpress SARS-CoV-2/FLU/RSV testing.  Fact Sheet for Patients: EntrepreneurPulse.com.au  Fact Sheet for Healthcare Providers: IncredibleEmployment.be  This test is not yet approved or cleared by the [place] FDA and has been authorized for detection and/or diagnosis of SARS-CoV-2 by FDA under an Emergency Use Authorization (EUA). This EUA will remain in effect (meaning this test  can be used) for the duration of the COVID-19 declaration under Section 564(b)(1) of the Act, 21 U.S.C. section 360bbb-3(b)(1), unless the authorization is terminated or revoked.  Performed at [facility], [place] [address] [place], [place] [address]     Blood Alcohol level:  Lab Results  Component Value Date   ETH <10 02/16/2021   ETH <10 [id number]    Metabolic Disorder Labs: Lab Results  Component Value Date   HGBA1C 5.7 (H) 02/17/2021   MPG 116.89 02/17/2021    No results found for: PROLACTIN Lab Results  Component Value Date   CHOL 163 02/17/2021   TRIG 183 (H) 02/17/2021   HDL 40 (L) 02/17/2021   CHOLHDL 4.1 02/17/2021   VLDL 37 02/17/2021   LDLCALC 86 02/17/2021   LDLCALC 114 (H) 08/26/2019    Physical Findings: AIMS: Facial and Oral Movements Muscles of Facial Expression: None, normal Lips and Perioral Area: None, normal Jaw: None, normal Tongue: None, normal,Extremity Movements Upper (arms, wrists, hands, fingers): None, normal Lower (legs, knees, ankles, toes): None, normal, Trunk Movements Neck, shoulders, hips: None, normal, Overall Severity Severity of abnormal movements (highest score from questions above): None, normal Incapacitation due to abnormal movements: None, normal Patient's awareness of abnormal movements (rate only patient's report): No Awareness, Dental Status Current problems with teeth and/or dentures?: No Does patient usually wear dentures?: No  CIWA:    COWS:     Musculoskeletal: Strength & Muscle Tone: within normal limits Gait & Station: normal Patient leans: N/A  Psychiatric Specialty Exam:  Presentation  General Appearance: Appropriate for Environment  Eye Contact:Minimal  Speech:Clear and Coherent  Speech Volume:Decreased  Handedness:No data recorded  Mood and Affect  Mood:Dysphoric  Affect:Depressed   Thought Process  Thought Processes:Goal Directed  Descriptions of Associations:Circumstantial  Orientation:Full (Time, Place and Person)  Thought Content:Logical  History of Schizophrenia/Schizoaffective disorder:No  Duration of Psychotic Symptoms:No data recorded Hallucinations:Hallucinations: None  Ideas of Reference:None  Suicidal Thoughts:Suicidal Thoughts: No  Homicidal Thoughts:Homicidal Thoughts: No   Sensorium  Memory:Immediate Good; Recent Good; Remote Good  Judgment:Fair  Insight:Fair   Executive Functions  Concentration:Fair  Attention  Span:Fair  Recall:No data recorded Fund of Knowledge:Fair  Language:Good   Psychomotor Activity  Psychomotor Activity:Psychomotor Activity: Decreased   Assets  Assets:Resilience   Sleep  Sleep:Sleep: Fair    Physical Exam: Physical Exam Constitutional:      Appearance: Normal appearance.  HENT:     Head: Normocephalic and atraumatic.  Pulmonary:     Effort: Pulmonary effort is normal.  Neurological:     Mental Status: He is alert and oriented to person, place, and time.   Review of Systems  Constitutional:  Positive for chills and malaise/fatigue. Negative for fever.  HENT:  Positive for congestion.   Neurological:  Positive for headaches.  Psychiatric/Behavioral:  Positive for depression. Negative for hallucinations and suicidal ideas.   Blood pressure (!) 138/91, pulse 87, temperature 98 F (36.7 C), temperature source Oral, resp. rate 16, height 5\' 11"  (1.803 m), weight 91.8 kg, SpO2 98 %. Body mass index is 28.23 kg/m.   Treatment Plan Summary: Daily contact with patient to assess and evaluate symptoms and progress in treatment and Medication management Andrew Parsons is a 41 yo patient w/ MDD and substance use that endorsed SI on presentation to [facility]. As expected patient does appear emotionally more stable than yesterday, but continues to appear dysphoric. Patient also appears to have symptoms of withdrawal from meth vs a virus. Patient is afebrile.  Repeat Covid labs: (-)   MDD, recurrent, severe w/o psychotic features Recent psychotic symptoms- resolved - Continue Zoloft  50mg  daily   Congestion - Continue Flonase daily - Start Mucinex DM 30-600mg  BID      PRN -Tylenol 650mg  q6h, pain - Advil 400mg  q6h  -Maalox 42ml q4h, indigestion -Atarax 25mg  TID, anxiety -Milk of Mag 33mL, constipation -Trazodone 50mg  QHS, insomnia  Agitation protocol: Zyprexa 5mg  q8h, Ativan 1mg , Geodon 20mg    Stimulant use disorder, severe, amphetamine type substance - SW  consult, patient appears to be ready to enter the  action stage of change and may consider Rehab  PGY-2 Freida Busman, MD 02/19/2021, 3:47 PM

## 2021-02-19 NOTE — Group Note (Signed)
LCSW Group Therapy Note °02/19/2021  11:15am-12:00pm ° °Type of Therapy and Topic:  Group Therapy: Anger and Commonalities ° °Participation Level:  Did Not Attend ° ° °Description of Group: °In this group, patients initially shared an "unknown" fact about themselves and CSW led a discussion about the ways in which we have things in common without realizing it.  Patient then identified a recent time they became angry and how this yet again showed a way in which they had something in common with other patients.  We discussed possible unhealthy reactions to anger and possible healthy reactions.  We also discussed possible underlying emotions that lead to the anger.  Commonalities among group members were pointed out throughout the entirety of group. ° °Therapeutic Goals: °Patients were asked to share something about themselves and learned that they often have things in common with other people without knowing this °Patients will remember an incident of anger and how they reacted °Patients will be able to identify their reaction as healthy or unhealthy, and identify possible reactions that would have been the opposite °Patients will learn that anger itself is a secondary emotion and will think about their primary emotion at the time of their last incident of anger ° °Summary of Patient Progress:  The patient was invited to group but did not attend. ° °Therapeutic Modalities:   °Cognitive Behavioral Therapy ° °Tanisia Yokley J Grossman-Orr, LCSW °02/19/2021  11:12 AM    °

## 2021-02-19 NOTE — Progress Notes (Signed)
Psychoeducational Group Note  Date:  02/19/2021 Time:  2015  Group Topic/Focus:  Wrap up group  Participation Level: Did Not Attend  Participation Quality:  Not Applicable  Affect:  Not Applicable  Cognitive:  Not Applicable  Insight:  Not Applicable  Engagement in Group: Not Applicable  Additional Comments:  Pt was asleep and did not attend.   Marcille Buffy 02/19/2021, 9:12 PM

## 2021-02-19 NOTE — Progress Notes (Signed)
Pt not feeling well, chills, cough, stuffy head.  Pt did not feel up to attending group, and has remained in his room.  Pt did come down to get VS taken (see VS flow sheet).  No acute distress noted, denied need for sleep aid.     02/19/21 2312  Psych Admission Type (Psych Patients Only)  Admission Status Voluntary  Psychosocial Assessment  Patient Complaints Anxiety;Substance abuse  Eye Contact Brief  Facial Expression Flat  Affect Appropriate to circumstance  Speech Logical/coherent  Interaction Guarded  Motor Activity Other (Comment) (WDL)  Appearance/Hygiene Unremarkable  Behavior Characteristics Appropriate to situation  Mood Depressed  Thought Process  Coherency Circumstantial  Content WDL  Delusions None reported or observed  Perception WDL  Hallucination None reported or observed  Judgment Impaired  Confusion None  Danger to Self  Current suicidal ideation? Denies  Self-Injurious Behavior No self-injurious ideation or behavior indicators observed or expressed   Agreement Not to Harm Self Yes  Description of Agreement verbal  Danger to Others  Danger to Others None reported or observed

## 2021-02-19 NOTE — Progress Notes (Addendum)
D: Patient states he slept well. He states he does not feel well due to heavy congestion. Covid test complete and negative. He has not attended groups today because of cold symptoms. He continues to endorse depressive symptoms; however, denies any self-harm thoughts. He does not appear to be responding to internal stimuli. Patient is motivated to stop using substances; he is interested in long-term rehabilitation.   A: Continue to monitor medication management and MD orders.  Safety checks completed every 15 minutes per protocol.  Offer support and encouragement as needed.  R: Patient isolative to room today due to cold symptoms.   02/19/21 0900  Psych Admission Type (Psych Patients Only)  Admission Status Voluntary  Psychosocial Assessment  Patient Complaints Anxiety  Eye Contact Brief  Facial Expression Anxious  Affect Appropriate to circumstance  Speech Logical/coherent  Interaction Guarded  Motor Activity Slow  Appearance/Hygiene Unremarkable  Behavior Characteristics Appropriate to situation  Mood Anxious  Thought Process  Coherency Circumstantial  Content WDL  Delusions None reported or observed  Perception WDL  Hallucination None reported or observed  Judgment Impaired  Confusion None  Danger to Self  Current suicidal ideation? Denies  Self-Injurious Behavior No self-injurious ideation or behavior indicators observed or expressed   Agreement Not to Harm Self Yes  Description of Agreement verbal  Danger to Others  Danger to Others None reported or observed

## 2021-02-19 NOTE — [facility] Suicide Risk Assessment (Signed)
[facility] INPATIENT:  Family/Significant Other Suicide Prevention Education  Suicide Prevention Education:  Patient Refusal for Family/Significant Other Suicide Prevention Education: The patient Andrew Parsons has refused to provide written consent for family/significant other to be provided Family/Significant Other Suicide Prevention Education during admission and/or prior to discharge.  Physician notified.  SPE completed with pt, as pt provided the name of a contact person, Dirk Dress), but does not have a phone number for this person. SPI pamphlet provided to pt and pt was encouraged to share information with support network, ask questions, and talk about any concerns relating to SPE. Pt denies access to guns/firearms and verbalized understanding of information provided. Mobile Crisis information also provided to pt.   Of note: pt will provide his cousin's number if he is able to obtain it during this stay in the hospital.    Rona Ravens LCSW 02/19/2021, 10:51 AM

## 2021-02-19 NOTE — BHH Counselor (Signed)
Adult Comprehensive Assessment  Patient ID: Andrew Parsons, male   DOB: 03-30-1980, 41 y.o.   MRN: [id number]  Information Source: Information source: Patient  Current Stressors:  Patient states their primary concerns and needs for treatment are:: (P) need to get off meth; get established in [place]; get a job; get help with depression and anxiety Patient states their goals for this hospitilization and ongoing recovery are:: (P) medication stabilization; link to community resources; including housing resources possibly Educational / Learning stressors: (P) GED Employment / Job issues: (P) not planning to return to work at [employer] in [employer] "because there are just too many obstacles Housing / Lack of housing: has been staying with his cousin, Caryn Bee for the past few weeks (in [place]). Prior to this, was living in [place] Physical health (include injuries & life threatening diseases): healthy Social relationships: poor-most friends and ex girlfriend abuse meth Substance abuse: meth use on and off x20 years. no other substance use per pt Bereavement / Loss: loss of relationship due to drug use--ex girlfriend. having a hard time detaching from her but plans to stay in [place]. (She lives in [place]).  Living/Environment/Situation:  Living Arrangements: Other relatives Living conditions (as described by patient or guardian): temporary; safe Who else lives in the home?: has been staying with his cousin. unsure if he can return How long has patient lived in current situation?: few weeks What is atmosphere in current home: Temporary, Supportive  Family History:  Marital status: Divorced Divorced, when?: few years ago What types of issues is patient dealing with in the relationship?: drug use Additional relationship information: n/a Are you sexually active?: Yes What is your sexual orientation?: heterosexual Has your sexual activity been affected by drugs, alcohol, medication, or  emotional stress?: n/a Does patient have children?: Yes How many children?: 2 How is patient's relationship with their children?: 2 adult children; and 2 grandchildren. has been unable to see his grandchildren for the past month due to issue with his daughter.  Childhood History:  By whom was/is the patient raised?: Both parents Additional childhood history information: "I had a pretty decent childhood." mom and dad both raised him Description of patient's relationship with caregiver when they were a child: close to both parents; dad was difficult to talk to Patient's description of current relationship with people who raised him/her: mom died when pt was 19yo. dad is still living--not very active in his life How were you disciplined when you got in trouble as a child/adolescent?: n/a Does patient have siblings?: Yes Number of Siblings: 2 Description of patient's current relationship with siblings: Pt is the middle of three boys. one brother lives in [place] and the other in [place]. "We talk sometimes." Did patient suffer any verbal/emotional/physical/sexual abuse as a child?: No Did patient suffer from severe childhood neglect?: No Has patient ever been sexually abused/assaulted/raped as an adolescent or adult?: No Was the patient ever a victim of a crime or a disaster?: No Witnessed domestic violence?: No Has patient been affected by domestic violence as an adult?: No  Education:  Highest grade of school patient has completed: GED Currently a Consulting civil engineer?: No Learning disability?: No  Employment/Work Situation:   Employment Situation: Unemployed Patient's Job has Been Impacted by Current Illness: Yes Describe how Patient's Job has Been Impacted: "It's not safe for me where I worked--too many drugs and temptations. 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?: No  Financial Resources:   Surveyor, quantity resources: No income, Media planner (Pt thinks his insurance Administrator) will lapse due to not returning to work) Does patient have a Lawyer or guardian?: No  Alcohol/Substance Abuse:   What has been your use of drugs/alcohol within the last 12 months?: meth use frequently/daily. intermittent use over the past 20 years. If attempted suicide, did drugs/alcohol play a role in this?: Yes Alcohol/Substance Abuse Treatment Hx: Denies past history If yes, describe treatment: none Has alcohol/substance abuse ever caused legal problems?: No  Social Support System:   Forensic psychologist System: Poor Describe Community Support System: cousin is only identified support; pt gave consent for CSW to speak with cousin, but did not have his phone number. Type of faith/religion: n/a How does patient's faith help to cope with current illness?: n/a  Leisure/Recreation:   Do You Have Hobbies?: No  Strengths/Needs:   What is the patient's perception of their strengths?: motivated to "get my life back on track." Patient states they can use these personal strengths during their treatment to contribute to their recovery: medication compliance; willing to attend therapy/SA outpatient treatment Patient states these barriers may affect/interfere with their treatment: no income; insurance will lapse soon; unstable housing--unsure if he can return to cousin's home. Patient states these barriers may affect their return to the community: housing/access to resources Other important information patient would like considered in planning for their treatment: n/a  Discharge Plan:   Currently receiving community mental health services: No Patient states concerns and preferences for aftercare planning are: none noted Patient states they will know when they are safe and ready for  discharge when: "When I feel less depressed and anxious and when I have an idea of what I am going to do." Does patient have access to transportation?: Yes (bus; possibly cousin if he is able to get in contact with him) Does patient have financial barriers related to discharge medications?: Yes Patient description of barriers related to discharge medications: insurance will lapse soon; no income currently Will patient be returning to same living situation after discharge?: Yes (likely if he can confirm that cousin will allow him back)  Summary/Recommendations:   Summary and Recommendations (to be completed by the evaluator): Pt is a 41yo male living in [place], [place] [facility] [place]) and has been "staying at my cousin's house lately." Pt is from [place] but does not plan to return due to drug use and toxic relationship with his exgirlfriend. Pt reports ongoing substance absue (meth) x20 years and denies any other substance use. Pt is divorced and has two adult children/2 grandchildren. Pt reports that he is currently single, has a GED and welding license, and is currently unemployed/not planning to return to [employer] in [place] due to "chaotic work Education officer, environmental of drug activity." Pt is hoping to remain in [place] with cousin, find stable employment, and stop using drugs. He is agreeable to [facility] for outpatient care. CSW assessing.  Rona Ravens LCSW 02/19/2021 10:50 AM

## 2021-02-20 DIAGNOSIS — F333 Major depressive disorder, recurrent, severe with psychotic symptoms: Principal | ICD-10-CM

## 2021-02-20 MED ORDER — SERTRALINE HCL 100 MG PO TABS
100.0000 mg | ORAL_TABLET | Freq: Every day | ORAL | Status: DC
  Administered 2021-02-21 – 2021-02-23 (×3): 100 mg via ORAL
  Filled 2021-02-20: qty 1
  Filled 2021-02-20: qty 14
  Filled 2021-02-20 (×3): qty 1

## 2021-02-20 NOTE — BHH Group Notes (Signed)
Pt did not attend orientation group.  

## 2021-02-20 NOTE — Progress Notes (Signed)
D:  Patient's self inventory sheet, patient sleeps good, sleeps medication helpful.  Good appetite, normal energy level, good concentration.  Rated depression, hopeless and anxiety 3.  Withdrawals, none checks.  Denied SI.  Denied physical problems, then checked lightheaded.  Denied physical pain.  Goal is dealing with people.  Plans to be around people more.  Denied discharge plans. A:  Medications administered per MD orders.  Emotional support and encouragement given patient. R:  Denied SI and HI, contracts for safety.  Denied A/V hallucinations.  Safety maintained with 15 minute checks.

## 2021-02-20 NOTE — Progress Notes (Signed)
Psychoeducational Group Note  Date:  02/20/2021 Time:  2018  Group Topic/Focus:  Wrap-Up Group:   The focus of this group is to help patients review their daily goal of treatment and discuss progress on daily workbooks.  Participation Level: Did Not Attend  Participation Quality:  Not Applicable  Affect:  Not Applicable  Cognitive:  Not Applicable  Insight:  Not Applicable  Engagement in Group: Not Applicable  Additional Comments:  The patient did not attend group this evening.   Andrew Parsons 02/20/2021, 8:20 PM

## 2021-02-20 NOTE — Plan of Care (Signed)
Nurse discussed coping skills with patient.  

## 2021-02-20 NOTE — Progress Notes (Signed)
°   02/20/21 2155  Psych Admission Type (Psych Patients Only)  Admission Status Voluntary  Psychosocial Assessment  Patient Complaints Substance abuse  Eye Contact Brief  Facial Expression Flat  Affect Appropriate to circumstance  Speech Logical/coherent  Interaction Guarded  Motor Activity Other (Comment) (WDL)  Appearance/Hygiene Unremarkable  Behavior Characteristics Appropriate to situation  Mood Depressed  Thought Process  Coherency Circumstantial  Content WDL  Delusions None reported or observed  Perception WDL  Hallucination None reported or observed  Judgment Impaired  Confusion None  Danger to Self  Current suicidal ideation? Denies  Self-Injurious Behavior No self-injurious ideation or behavior indicators observed or expressed   Agreement Not to Harm Self Yes  Description of Agreement verbal  Danger to Others  Danger to Others None reported or observed

## 2021-02-20 NOTE — Progress Notes (Signed)
[facility] MD Progress Note  02/20/2021 2:10 PM Andrew Parsons  MRN:  [id number] Subjective:  Andrew Parsons is a 41 yo patient with no PPH documented in his EMR who presented to [facility] voluntarily endorsing SI and was transferred to [facility].    Case was discussed in the multidisciplinary team. MAR was reviewed and patient was compliant with medications.  He did not require any PRN's for agitation.     Psychiatric Team made the following recommendations yesterday:  - Continue Zoloft  50mg  daily - Continue Flonase daily - Start Mucinex DM 30-600mg  BID  Per staff patient was a bit resistant and had overall negative outlook during his SW assessment. Patient did stay in his room most of the day and did require medication for his physical ailments.   ON assessment this AM patient appears less congested and less physically ill. Patient reports that he slept well, but he does not enjoy group because he believes it only reminds him of how alone he is. Patient reports that he feels that everyone suggest reaching out to your "support system" and patient reports he does not have this. Patient endorses passive SI and reports that he feels that he is going through so much. Patient reports that he has not looked at the rehab programs offered by SW yesterday. Patient retrieved the packet when provider asked. Patient reported that he was concerned that he wont have insurance for these places as he believes he will lose his job. Patient was reassured that he will receive a note to take to work, informing them that he was ill. Provider also suggested that he reach out to his job to let them know he was currently sick. Patient endorsed that this would be possible. Provider gave patient the task of looking at the rehab programs that heave been offered to get a better understanding of a discharge plan. Patient endorsed he would work on this today. Patient denied HI and AVH.   Principal Problem: MDD (major depressive disorder), recurrent,  severe, with psychosis ([place]) Diagnosis: Principal Problem:   MDD (major depressive disorder), recurrent, severe, with psychosis ([place]) Active Problems:   Stimulant use disorder  Total Time spent with patient: 20 minutes  Past Psychiatric History: See H&P  Past Medical History:  Past Medical History:  Diagnosis Date   Alcohol abuse    Anxiety 08/26/2019   Atypical syncope 08/26/2019   Depression    Fainting spell    Fatigue 08/26/2019   Hyperlipidemia    Hypertension    Polysubstance (excluding opioids) dependence ([place]) 08/26/2019   Sleep disturbance 08/26/2019    Past Surgical History:  Procedure Laterality Date   TYMPANOPLASTY     Family History:  Family History  Problem Relation Age of Onset   Early death Mother    Stroke Mother    Depression Daughter    Hyperlipidemia Daughter    Family Psychiatric  History: See H&P Social History:  Social History   Substance and Sexual Activity  Alcohol Use Yes   Comment: not now but has abused it the past and episodic binge      Social History   Substance and Sexual Activity  Drug Use Not Currently   Types: Methamphetamines, Cocaine, Marijuana   Comment: Substance use since 41 yo     Social History   Socioeconomic History   Marital status: Single    Spouse name: Not on file   Number of children: Not on file   Years of education: Not on file  Highest education level: GED or equivalent  Occupational History   Occupation: Printmaker   Tobacco Use   Smoking status: Every Day    Packs/day: 0.50    Types: Cigarettes   Smokeless tobacco: Never  Vaping Use   Vaping Use: Never used  Substance and Sexual Activity   Alcohol use: Yes    Comment: not now but has abused it the past and episodic binge    Drug use: Not Currently    Types: Methamphetamines, Cocaine, Marijuana    Comment: Substance use since 41 yo    Sexual activity: Yes  Other Topics Concern   Not on file  Social History Narrative   Lives with a  friend . He has 2 children and 2 grandkids .    Social Determinants of Health   Financial Resource Strain: Not on file  Food Insecurity: Not on file  Transportation Needs: Not on file  Physical Activity: Not on file  Stress: Not on file  Social Connections: Not on file   Additional Social History:                         Sleep: Good  Appetite:  Good  Current Medications: Current Facility-Administered Medications  Medication Dose Route Frequency Provider Last Rate Last Admin   acetaminophen (TYLENOL) tablet 650 mg  650 mg Oral Q6H PRN Prescilla Sours, PA-C   650 mg at 02/17/21 2107   alum & mag hydroxide-simeth (MAALOX/MYLANTA) 200-200-20 MG/5ML suspension 30 mL  30 mL Oral Q4H PRN Lovena Le, Cody W, PA-C       amLODipine (NORVASC) tablet 5 mg  5 mg Oral Daily Massengill, Nathan, MD   5 mg at 02/20/21 0828   dextromethorphan-guaiFENesin ([place] DM) 30-600 MG per 12 hr tablet 1 tablet  1 tablet Oral BID Damita Dunnings B, MD   1 tablet at 02/20/21 0828   fluticasone (FLONASE) 50 MCG/ACT nasal spray 1 spray  1 spray Each Nare Daily Massengill, Ovid Curd, MD   1 spray at 02/20/21 [id number]   hydrOXYzine (ATARAX) tablet 25 mg  25 mg Oral TID PRN Prescilla Sours, PA-C   25 mg at 02/20/21 [id number]   ibuprofen (ADVIL) tablet 400 mg  400 mg Oral Q6H PRN Janine Limbo, MD   400 mg at 02/18/21 1514   OLANZapine zydis (ZYPREXA) disintegrating tablet 5 mg  5 mg Oral Q8H PRN Freida Busman, MD       And   LORazepam (ATIVAN) tablet 1 mg  1 mg Oral PRN Freida Busman, MD       And   ziprasidone (GEODON) injection 20 mg  20 mg Intramuscular PRN Damita Dunnings B, MD       magnesium hydroxide (MILK OF MAGNESIA) suspension 30 mL  30 mL Oral Daily PRN Prescilla Sours, PA-C       [START ON 02/21/2021] sertraline (ZOLOFT) tablet 100 mg  100 mg Oral Daily Damita Dunnings B, MD       traZODone (DESYREL) tablet 50 mg  50 mg Oral QHS PRN Prescilla Sours, PA-C        Lab Results:  Results for orders placed or  performed during the hospital encounter of 02/17/21 (from the past 48 hour(s))  Resp Panel by RT-PCR (Flu A&B, Covid) Nasopharyngeal Swab     Status: None   Collection Time: 02/19/21 10:13 AM   Specimen: Nasopharyngeal Swab; Nasopharyngeal(NP) swabs in vial transport medium  Result Value Ref Range  SARS Coronavirus 2 by RT PCR NEGATIVE NEGATIVE    Comment: (NOTE) SARS-CoV-2 target nucleic acids are NOT DETECTED.  The SARS-CoV-2 RNA is generally detectable in upper respiratory specimens during the acute phase of infection. The lowest concentration of SARS-CoV-2 viral copies this assay can detect is 138 copies/mL. A negative result does not preclude SARS-Cov-2 infection and should not be used as the sole basis for treatment or other patient management decisions. A negative result may occur with  improper specimen collection/handling, submission of specimen other than nasopharyngeal swab, presence of viral mutation(s) within the areas targeted by this assay, and inadequate number of viral copies(<138 copies/mL). A negative result must be combined with clinical observations, patient history, and epidemiological information. The expected result is Negative.  Fact Sheet for Patients:  EntrepreneurPulse.com.au  Fact Sheet for Healthcare Providers:  IncredibleEmployment.be  This test is no t yet approved or cleared by the [place] FDA and  has been authorized for detection and/or diagnosis of SARS-CoV-2 by FDA under an Emergency Use Authorization (EUA). This EUA will remain  in effect (meaning this test can be used) for the duration of the COVID-19 declaration under Section 564(b)(1) of the Act, 21 U.S.C.section 360bbb-3(b)(1), unless the authorization is terminated  or revoked sooner.       Influenza A by PCR NEGATIVE NEGATIVE   Influenza B by PCR NEGATIVE NEGATIVE    Comment: (NOTE) The Xpert Xpress SARS-CoV-2/FLU/RSV plus assay is intended  as an aid in the diagnosis of influenza from Nasopharyngeal swab specimens and should not be used as a sole basis for treatment. Nasal washings and aspirates are unacceptable for Xpert Xpress SARS-CoV-2/FLU/RSV testing.  Fact Sheet for Patients: EntrepreneurPulse.com.au  Fact Sheet for Healthcare Providers: IncredibleEmployment.be  This test is not yet approved or cleared by the [place] FDA and has been authorized for detection and/or diagnosis of SARS-CoV-2 by FDA under an Emergency Use Authorization (EUA). This EUA will remain in effect (meaning this test can be used) for the duration of the COVID-19 declaration under Section 564(b)(1) of the Act, 21 U.S.C. section 360bbb-3(b)(1), unless the authorization is terminated or revoked.  Performed at [facility], [place] [address], [place], [place] [address]     Blood Alcohol level:  Lab Results  Component Value Date   ETH <10 02/16/2021   ETH <10 [id number]    Metabolic Disorder Labs: Lab Results  Component Value Date   HGBA1C 5.7 (H) 02/17/2021   MPG 116.89 02/17/2021   No results found for: PROLACTIN Lab Results  Component Value Date   CHOL 163 02/17/2021   TRIG 183 (H) 02/17/2021   HDL 40 (L) 02/17/2021   CHOLHDL 4.1 02/17/2021   VLDL 37 02/17/2021   LDLCALC 86 02/17/2021   LDLCALC 114 (H) 08/26/2019    Physical Findings: AIMS: Facial and Oral Movements Muscles of Facial Expression: None, normal Lips and Perioral Area: None, normal Jaw: None, normal Tongue: None, normal,Extremity Movements Upper (arms, wrists, hands, fingers): None, normal Lower (legs, knees, ankles, toes): None, normal, Trunk Movements Neck, shoulders, hips: None, normal, Overall Severity Severity of abnormal movements (highest score from questions above): None, normal Incapacitation due to abnormal movements: None, normal Patient's awareness of abnormal movements (rate only  patient's report): No Awareness, Dental Status Current problems with teeth and/or dentures?: No Does patient usually wear dentures?: No  CIWA:    COWS:     Musculoskeletal: Strength & Muscle Tone: within normal limits Gait & Station: normal Patient leans: N/A  Psychiatric Specialty Exam:  Presentation  General Appearance: Appropriate for Environment  Eye Contact:Minimal  Speech:Clear and Coherent  Speech Volume:Decreased  Handedness:No data recorded  Mood and Affect  Mood:Dysphoric  Affect:Depressed   Thought Process  Thought Processes:Goal Directed  Descriptions of Associations:Circumstantial  Orientation:Full (Time, Place and Person)  Thought Content:Logical  History of Schizophrenia/Schizoaffective disorder:No  Duration of Psychotic Symptoms:No data recorded Hallucinations:Hallucinations: None  Ideas of Reference:None  Suicidal Thoughts:Suicidal Thoughts: No  Homicidal Thoughts:Homicidal Thoughts: No   Sensorium  Memory:Immediate Good; Recent Good; Remote Good  Judgment:Fair  Insight:Fair   Executive Functions  Concentration:Fair  Attention Span:Fair  Recall:No data recorded Fund of Knowledge:Fair  Language:Good   Psychomotor Activity  Psychomotor Activity:Psychomotor Activity: Decreased   Assets  Assets:Resilience   Sleep  Sleep:Sleep: Fair    Physical Exam: Physical Exam Constitutional:      Appearance: Normal appearance.  HENT:     Head: Normocephalic and atraumatic.  Pulmonary:     Effort: Pulmonary effort is normal.  Neurological:     Mental Status: He is alert and oriented to person, place, and time.   Review of Systems  Psychiatric/Behavioral:  Positive for depression and suicidal ideas. Negative for hallucinations. The patient does not have insomnia.   Blood pressure 136/72, pulse 86, temperature 98.6 F (37 C), temperature source Oral, resp. rate 20, height 5\' 11"  (1.803 m), weight 91.8 kg, SpO2 94 %. Body  mass index is 28.23 kg/m.   Treatment Plan Summary: Daily contact with patient to assess and evaluate symptoms and progress in treatment and Medication management Thiry is a 41 yo patient w/ MDD and substance use that endorsed SI on presentation to [facility]. Patient appears to be recovering physically but still appears dysphoric and endorsed passive SI today. Patient needs increase in Zoloft. Patient will need to continue to work with SW.  MDD, recurrent, severe w/o psychotic features Recent psychotic symptoms- resolved - Increase Zoloft  to 100mg  daily   Congestion - Continue Flonase daily - Start Mucinex DM 30-600mg  BID       PRN -Tylenol 650mg  q6h, pain - Advil 400mg  q6h  -Maalox 26ml q4h, indigestion -Atarax 25mg  TID, anxiety -Milk of Mag 74mL, constipation -Trazodone 50mg  QHS, insomnia  Agitation protocol: Zyprexa 5mg  q8h, Ativan 1mg , Geodon 20mg    Stimulant use disorder, severe, amphetamine type substance - SW consult, patient appears to be ready to enter the  action stage of change and may consider Rehab  PGY-2 Freida Busman, MD 02/20/2021, 2:10 PM

## 2021-02-20 NOTE — BHH Group Notes (Signed)
Adult Psychoeducational Group Note  Date:  02/20/2021 Time:  3:49 PM  Group Topic/Focus:  Leisure Education.  Patient attended leisure education group.He shared that a leisure activity he enjoys is "coloring".   Red Mandt E Anchor Dwan 02/20/2021, 3:49 PM

## 2021-02-21 NOTE — BHH Counselor (Addendum)
Tried to give pt the list of [employer] in [place]. Pt stated he cannot stay in [place] as he has no job here and that CSW am not providing him with realistic options. He says he has a job in [place] and that is where he needs to be. CSW told patient she would get him shelter/ [facility] options in [place] but that they would be very limited as the city is small and their resources are limited. CSW took this information back to the patient (who was still in bed) and pt began to yell at me and state "I know all these damn resources and these aren't going to work. Y'all just need to let me out of here so I can do this on my own."  Fredirick Lathe, [employer] Clinicial Social Worker [employer]

## 2021-02-21 NOTE — Progress Notes (Signed)
°  D: Patient is alert and oriented x 4. Patient denies SI/HI/ AVH and pain. Disposition is Calm and cooperative with appropriate affect. Verbally contracts for safety to this Clinical research associate.   A:  Pt was given scheduled medications. Pt was encourage to attend groups. Q 15 minute checks were done for safety. Pt was visualized interacting appropriately with others in the milieu. Pt was offered support and encouragement by this Clinical research associate. Pt attended groups and interacts well with peers and staff. No signs of distress nor concerns reported by patient at present.Pt remains receptive to treatment and safety maintained on unit.    R: Will continue to monitor and assess. Safety maintained during this shift.       02/21/21 2100  Psych Admission Type (Psych Patients Only)  Admission Status Voluntary  Psychosocial Assessment  Patient Complaints None  Eye Contact Brief  Facial Expression Flat  Affect Blunted;Sad  Speech Soft;Slow  Interaction Guarded  Motor Activity Slow  Appearance/Hygiene Unremarkable  Behavior Characteristics Calm;Guarded  Mood Empty;Depressed  Thought Process  Coherency Circumstantial  Content WDL  Delusions None reported or observed  Perception WDL  Hallucination None reported or observed  Judgment Impaired  Confusion None  Danger to Self  Current suicidal ideation? Denies  Self-Injurious Behavior No self-injurious ideation or behavior indicators observed or expressed   Agreement Not to Harm Self Yes  Description of Agreement verbal  Danger to Others  Danger to Others None reported or observed   Pt slept most of the night. No signs of distress. Will continue to monitor and assess. Safety maintained

## 2021-02-21 NOTE — Group Note (Signed)
LCSW Group Therapy Note   Group Date: 02/21/2021 Start Time: 1300 End Time: 1400  Type of Therapy and Topic:  Group Therapy:  Self-Esteem   Participation Level:  Active  Description of Group: This group addressed positive self-esteem. Patients were given a worksheet with a blank shield. Patients were asked what a shield is and when it is used. Patients were asked to list, draw, or write protective factors in the their lives on their shields. Patients discussed the words, ideas and drawings that they put on their shield. Patients were encouraged to have a daily reflection of positive characteristics/ protective factors.  Therapeutic Goals Patient will verbalize their positive qualities Patient will demonstrate insight but naming social supports in their lives Patient will verbalize their feelings when voicing positive self affirmations and when voicing positive affirmations of others Patients will discuss the potential positive impact on their wellness/recovery of focusing on positive traits of self and others.  Summary of Patient Progress: Pt shared during introductions that one things that makes him smile is poot noises. Pt completed his worksheet but left before discussion and did not return.   Felizardo Hoffmann, LCSWA 02/21/2021  1:49 PM

## 2021-02-21 NOTE — BHH Counselor (Signed)
CSW gave pt information on residential substance use treatment options that accept his insurance including [facility], [facility], [facility], [facility], and [facility].   Fredirick Lathe, LCSWA Clinicial Social Worker [facility]

## 2021-02-21 NOTE — BHH Group Notes (Signed)
PT was informed but did not attend group. ?

## 2021-02-21 NOTE — Group Note (Signed)
Recreation Therapy Group Note   Group Topic:Stress Management  Group Date: 02/21/2021 Start Time: 0930 End Time: 0950 Facilitators: Caroll Rancher, [place] Location: [address] Dayroom   Goal Area(s) Addresses:  Patient will identify positive stress management techniques. Patient will identify benefits of using stress management post d/c.  Group Description:  Meditation.  LRT played a meditation that focused on effortlessness.  The meditation was encouraging patients to take the time do nothing and embrace just being.  Patients were to listen and follow along as meditation played to fully engage in activity.     Affect/Mood: N/A   Participation Level: Did not attend    Clinical Observations/Individualized Feedback:     Plan: Continue to engage patient in RT group sessions 2-3x/week.   Caroll Rancher, LRT,CTRS 02/21/2021 11:06 AM

## 2021-02-21 NOTE — Progress Notes (Signed)
D: Patient's cold symptoms have improved over the week-end. He remains withdrawn and is minimal with staff and his peers. Patient is resistant to group activities; however he did attend a psychoeducational group yesterday. He is isolative; however, he is visible in the milieu and has been going to the cafeteria. He states he slept well last night. He denies any thoughts of self harm today.  A: Continue to monitor medication management and MD orders.  Safety checks completed every 15 minutes per protocol.  Offer support and encouragement as needed.  R: Patient is withdrawn and isolative.    02/21/21 0900  Psych Admission Type (Psych Patients Only)  Admission Status Voluntary  Psychosocial Assessment  Patient Complaints None  Eye Contact Brief  Facial Expression Flat  Affect Blunted;Sad  Speech Soft;Slow  Interaction Guarded  Motor Activity Slow  Appearance/Hygiene Unremarkable  Behavior Characteristics Guarded  Mood Depressed  Thought Process  Coherency Circumstantial  Content WDL  Delusions None reported or observed  Perception WDL  Hallucination None reported or observed  Judgment Impaired  Confusion None  Danger to Self  Current suicidal ideation? Denies  Self-Injurious Behavior No self-injurious ideation or behavior indicators observed or expressed   Agreement Not to Harm Self Yes  Description of Agreement verbal  Danger to Others  Danger to Others None reported or observed

## 2021-02-21 NOTE — Progress Notes (Signed)
[facility] MD Progress Note  02/21/2021 5:12 PM Andrew Parsons  MRN:  [id number] Subjective:   Andrew Parsons is a 41 yo patient with no PPH documented in his EMR who presented to [facility] voluntarily endorsing SI and was transferred to [facility].    Case was discussed in the multidisciplinary team. MAR was reviewed and patient was compliant with medications.  He did not require any PRN's for agitation.     Psychiatric Team made the following recommendations yesterday: - Increase Zoloft  to 100mg  daily  Per staff patient has been a bit more isolated to his room; however there is decreased access to day room due to lack of staffing.  When provider interacts with patient he is interested in going to the day room but endorses a difficulty keep up with what is open or close as when he tries to go as often closed.  Staff also endorsed that patient is very confused about his resource options and his plan at discharge.  On assessment today patient reports that he spoke with one of the techs and he does not believe that he can afford the time off from work for an inpatient rehab.  Patient reports that he is very anxious that he will lose his job and endorses that he knows he needs to work.  Patient reports that he believes that if he has to go to rehab he will lose his job and thus lose his insurance and will be able to pay for the rehab.  Patient reports that he is also very concerned about where he will sleep when he leaves.  Despite patient significant level of anxiety endorsed he reports that he does feel more motivated and better than he did on presentation.  Patient reports that he is still determined to stop his crystal meth use.  Patient denies SI, HI and AVH.  Patient reports that he is eating well and is able to sleep well at night.  Principal Problem: MDD (major depressive disorder), recurrent, severe, with psychosis ([place]) Diagnosis: Principal Problem:   MDD (major depressive disorder), recurrent, severe, with  psychosis ([place]) Active Problems:   Stimulant use disorder  Total Time spent with patient: 20 minutes  Past Psychiatric History: See H&P  Past Medical History:  Past Medical History:  Diagnosis Date   Alcohol abuse    Anxiety 08/26/2019   Atypical syncope 08/26/2019   Depression    Fainting spell    Fatigue 08/26/2019   Hyperlipidemia    Hypertension    Polysubstance (excluding opioids) dependence ([place]) 08/26/2019   Sleep disturbance 08/26/2019    Past Surgical History:  Procedure Laterality Date   TYMPANOPLASTY     Family History:  Family History  Problem Relation Age of Onset   Early death Mother    Stroke Mother    Depression Daughter    Hyperlipidemia Daughter    Family Psychiatric  History: See H&P Social History:  Social History   Substance and Sexual Activity  Alcohol Use Yes   Comment: not now but has abused it the past and episodic binge      Social History   Substance and Sexual Activity  Drug Use Not Currently   Types: Methamphetamines, Cocaine, Marijuana   Comment: Substance use since 41 yo     Social History   Socioeconomic History   Marital status: Single    Spouse name: Not on file   Number of children: Not on file   Years of education: Not on file  Highest education level: GED or equivalent  Occupational History   Occupation: Printmaker   Tobacco Use   Smoking status: Every Day    Packs/day: 0.50    Types: Cigarettes   Smokeless tobacco: Never  Vaping Use   Vaping Use: Never used  Substance and Sexual Activity   Alcohol use: Yes    Comment: not now but has abused it the past and episodic binge    Drug use: Not Currently    Types: Methamphetamines, Cocaine, Marijuana    Comment: Substance use since 41 yo    Sexual activity: Yes  Other Topics Concern   Not on file  Social History Narrative   Lives with a friend . He has 2 children and 2 grandkids .    Social Determinants of Health   Financial Resource Strain: Not on file   Food Insecurity: Not on file  Transportation Needs: Not on file  Physical Activity: Not on file  Stress: Not on file  Social Connections: Not on file   Additional Social History:                         Sleep: Good  Appetite:  Good  Current Medications: Current Facility-Administered Medications  Medication Dose Route Frequency Provider Last Rate Last Admin   acetaminophen (TYLENOL) tablet 650 mg  650 mg Oral Q6H PRN Prescilla Sours, PA-C   650 mg at 02/21/21 1326   alum & mag hydroxide-simeth (MAALOX/MYLANTA) 200-200-20 MG/5ML suspension 30 mL  30 mL Oral Q4H PRN Lovena Le, Cody W, PA-C       amLODipine (NORVASC) tablet 5 mg  5 mg Oral Daily Massengill, Nathan, MD   5 mg at 02/21/21 0740   dextromethorphan-guaiFENesin ([place] DM) 30-600 MG per 12 hr tablet 1 tablet  1 tablet Oral BID Damita Dunnings B, MD   1 tablet at 02/21/21 1637   fluticasone (FLONASE) 50 MCG/ACT nasal spray 1 spray  1 spray Each Nare Daily Massengill, Ovid Curd, MD   1 spray at 02/20/21 [id number]   hydrOXYzine (ATARAX) tablet 25 mg  25 mg Oral TID PRN Prescilla Sours, PA-C   25 mg at 02/20/21 [id number]   ibuprofen (ADVIL) tablet 400 mg  400 mg Oral Q6H PRN Janine Limbo, MD   400 mg at 02/18/21 1514   OLANZapine zydis (ZYPREXA) disintegrating tablet 5 mg  5 mg Oral Q8H PRN Freida Busman, MD       And   LORazepam (ATIVAN) tablet 1 mg  1 mg Oral PRN Freida Busman, MD       And   ziprasidone (GEODON) injection 20 mg  20 mg Intramuscular PRN Damita Dunnings B, MD       magnesium hydroxide (MILK OF MAGNESIA) suspension 30 mL  30 mL Oral Daily PRN Lovena Le, Cody W, PA-C       sertraline (ZOLOFT) tablet 100 mg  100 mg Oral Daily Damita Dunnings B, MD   100 mg at 02/21/21 0740   traZODone (DESYREL) tablet 50 mg  50 mg Oral QHS PRN Prescilla Sours, PA-C        Lab Results: No results found for this or any previous visit (from the past 48 hour(s)).  Blood Alcohol level:  Lab Results  Component Value Date   [facility] <10  02/16/2021   ETH <10 [id number]    Metabolic Disorder Labs: Lab Results  Component Value Date   HGBA1C 5.7 (H) 02/17/2021   MPG 116.89  02/17/2021   No results found for: PROLACTIN Lab Results  Component Value Date   CHOL 163 02/17/2021   TRIG 183 (H) 02/17/2021   HDL 40 (L) 02/17/2021   CHOLHDL 4.1 02/17/2021   VLDL 37 02/17/2021   LDLCALC 86 02/17/2021   LDLCALC 114 (H) 08/26/2019    Physical Findings: AIMS: Facial and Oral Movements Muscles of Facial Expression: None, normal Lips and Perioral Area: None, normal Jaw: None, normal Tongue: None, normal,Extremity Movements Upper (arms, wrists, hands, fingers): None, normal Lower (legs, knees, ankles, toes): None, normal, Trunk Movements Neck, shoulders, hips: None, normal, Overall Severity Severity of abnormal movements (highest score from questions above): None, normal Incapacitation due to abnormal movements: None, normal Patient's awareness of abnormal movements (rate only patient's report): No Awareness, Dental Status Current problems with teeth and/or dentures?: No Does patient usually wear dentures?: No  CIWA:    COWS:     Musculoskeletal: Strength & Muscle Tone: within normal limits Gait & Station: normal Patient leans: N/A  Psychiatric Specialty Exam:  Presentation  General Appearance: Appropriate for Environment  Eye Contact:Minimal  Speech:Clear and Coherent  Speech Volume:Decreased  Handedness:No data recorded  Mood and Affect  Mood:Dysphoric  Affect:Depressed   Thought Process  Thought Processes:Goal Directed  Descriptions of Associations:Circumstantial  Orientation:Full (Time, Place and Person)  Thought Content:Logical  History of Schizophrenia/Schizoaffective disorder:No  Duration of Psychotic Symptoms:No data recorded Hallucinations:No data recorded Ideas of Reference:None  Suicidal Thoughts:No data recorded Homicidal Thoughts:No data recorded  Sensorium  Memory:Immediate  Good; Recent Good; Remote Good  Judgment:Fair  Insight:Fair   Executive Functions  Concentration:Fair  Attention Span:Fair  Recall:No data recorded Fund of Knowledge:Fair  Language:Good   Psychomotor Activity  Psychomotor Activity:No data recorded  Assets  Assets:Resilience   Sleep  Sleep:No data recorded   Physical Exam: Physical Exam Constitutional:      Appearance: Normal appearance.  HENT:     Head: Normocephalic and atraumatic.  Pulmonary:     Effort: Pulmonary effort is normal.  Neurological:     Mental Status: He is alert and oriented to person, place, and time.   Review of Systems  Psychiatric/Behavioral:  Negative for hallucinations and suicidal ideas. The patient is nervous/anxious. The patient does not have insomnia.   Blood pressure 120/80, pulse 94, temperature 98.2 F (36.8 C), temperature source Oral, resp. rate 20, height 5\' 11"  (1.803 m), weight 91.8 kg, SpO2 100 %. Body mass index is 28.23 kg/m.   Treatment Plan Summary: Daily contact with patient to assess and evaluate symptoms and progress in treatment and Medication management Andrew Parsons is a 41 yo patient w/ MDD and substance use that endorsed SI on presentation to [facility].  Patient continues to have a lot of anxiety associated with his dispo but his mood overall does appear to be improving with Zoloft.  Patient likely is benefiting more from Zoloft at 100 mg and continues to sleep well.  There is some concern from staff that the patient may be bit more irritable during the day, we will continue to monitor.  It is also possible that patient is frustrated and confused with his current situation.  Patient appears somewhat receptive to advice offered by staff.  MDD, recurrent, severe w/o psychotic features Recent psychotic symptoms- resolved -Continue Zoloft  to 100mg  daily   Congestion - Continue Flonase daily - Start Mucinex DM 30-600mg  BID       PRN -Tylenol 650mg  q6h, pain - Advil  400mg  q6h  -Maalox 77ml q4h, indigestion -Atarax  25mg  TID, anxiety -Milk of Mag 61mL, constipation -Trazodone 50mg  QHS, insomnia  Agitation protocol: Zyprexa 5mg  q8h, Ativan 1mg , Geodon 20mg    Stimulant use disorder, severe, amphetamine type substance - SW consult, patient appears to be ready to enter the  action stage of change and may consider Rehab   PGY-2 Freida Busman, MD 02/21/2021, 5:12 PM

## 2021-02-21 NOTE — BHH Counselor (Signed)
Pt was given information about [facility] [employer] and [employer].   Fredirick Lathe, LCSWA Clinicial Social Worker [employer]

## 2021-02-22 MED ORDER — AMLODIPINE BESYLATE 5 MG PO TABS: 5.0000 mg | ORAL_TABLET | Freq: Every day | ORAL | 0 refills | Status: DC

## 2021-02-22 MED ORDER — SERTRALINE HCL 100 MG PO TABS: 100.0000 mg | ORAL_TABLET | Freq: Every day | ORAL | 0 refills | Status: AC

## 2021-02-22 MED ORDER — SERTRALINE HCL 100 MG PO TABS: 100.0000 mg | ORAL_TABLET | Freq: Every day | ORAL | 0 refills | Status: DC

## 2021-02-22 NOTE — Group Note (Signed)
.  bhhrtRecreation Therapy Group Note   Group Topic:Animal Assisted Therapy   Group Date: 02/22/2021 Start Time: 1430 End Time: 1515 Facilitators: Caroll Rancher, LRT,CTRS Location: [address] Dayroom   Animal-Assisted Activity (AAA) Program Checklist/Progress Note Patient Eligibility Criteria Checklist & Daily Group note for Rec Tx Intervention  AAA/T Program Assumption of Risk Form signed by Patient/ or Parent Legal Guardian YES    Patient is free of allergies or severe asthma  YES  Patient reports no fear of animals YES  Patient reports no history of cruelty to animals YES  Patient understands their participation is voluntary YES  Patient washes hands before animal contact YES  Patient washes hands after animal contact YES   Group Description: Patients provided opportunity to interact with trained and credentialed [facility] Therapy dog and the community volunteer/dog handler. Patients practiced appropriate animal interaction and were educated on dog safety outside of the hospital in common community settings. Patients were allowed to use dog toys and other items to practice commands, engage the dog in play, and/or complete routine aspects of animal care.   Education: Charity fundraiser, Health visitor, Communication & Social Skills    Affect/Mood: Appropriate   Participation Level: Engaged   Education Outcome:  Acknowledges education and In group clarification offered    Clinical Observations/Individualized Feedback: Pt attended and participated in group.  Pt was appropriate engaging with therapy dog team and sharing stories about personal pets at home.   Plan: Continue to engage patient in RT group sessions 2-3x/week.   Caroll Rancher, Antonietta Jewel 02/22/2021 3:27 PM

## 2021-02-22 NOTE — Plan of Care (Signed)
°  Problem: Health Behavior/Discharge Planning: Goal: Compliance with therapeutic regimen will improve Outcome: Progressing   Problem: Safety: Goal: Ability to disclose and discuss suicidal ideas will improve Outcome: Progressing Goal: Ability to identify and utilize support systems that promote safety will improve Outcome: Progressing

## 2021-02-22 NOTE — Progress Notes (Addendum)
[facility] MD Progress Note  02/22/2021 4:24 PM Andrew Parsons  MRN:  [id number] Subjective:   Andrew Parsons is a 41 yo patient with no PPH documented in his EMR who presented to [facility] voluntarily endorsing SI and was transferred to [facility].    Case was discussed in the multidisciplinary team. MAR was reviewed and patient was compliant with medications.  He did not require any PRN's for agitation.     Psychiatric Team made the following recommendations yesterday: -Continue Zoloft  to 100mg  daily  Per staff patient is attending some groups, but say child-like inappropriate jokes at times.   On interview this morning the pt reports that his mood is improving and feels less down depressed and sad. He reports anxiety symptoms are less. He reports that sleep is improving and that appetite is stable. Patient denies having suicidal thoughts, including thoughts that life is not worth living. He is future oriented. He denies HI and AVH. Patient reports that his thoughts are a bit cloudy this AM; however later in the day he reported to to attending provider this improved over the day.   Patient reports that he found out that he lost his job due since being in the hospital. Patient and provider discuss how patient has actually received what he has been hoping for and no longer as any ties to the community has feels would not be conducive to him becoming sober. Patient agrees and reports that he had not thought of this on his own and reports he would like to continue to reach out to more of the resources he has been provided, since he no longer has to worry about keeping his job in [employer].   Principal Problem: MDD (major depressive disorder), recurrent, severe, with psychosis ([place]) Diagnosis: Principal Problem:   MDD (major depressive disorder), recurrent, severe, with psychosis ([place]) Active Problems:   Stimulant use disorder  Total Time spent with patient: 20 minutes  Past Psychiatric History: See H&P  Past Medical  History:  Past Medical History:  Diagnosis Date   Alcohol abuse    Anxiety 08/26/2019   Atypical syncope 08/26/2019   Depression    Fainting spell    Fatigue 08/26/2019   Hyperlipidemia    Hypertension    Polysubstance (excluding opioids) dependence ([place]) 08/26/2019   Sleep disturbance 08/26/2019    Past Surgical History:  Procedure Laterality Date   TYMPANOPLASTY     Family History:  Family History  Problem Relation Age of Onset   Early death Mother    Stroke Mother    Depression Daughter    Hyperlipidemia Daughter    Family Psychiatric  History: See H&P Social History:  Social History   Substance and Sexual Activity  Alcohol Use Yes   Comment: not now but has abused it the past and episodic binge      Social History   Substance and Sexual Activity  Drug Use Not Currently   Types: Methamphetamines, Cocaine, Marijuana   Comment: Substance use since 41 yo     Social History   Socioeconomic History   Marital status: Single    Spouse name: Not on file   Number of children: Not on file   Years of education: Not on file   Highest education level: GED or equivalent  Occupational History   Occupation: Printmaker   Tobacco Use   Smoking status: Every Day    Packs/day: 0.50    Types: Cigarettes   Smokeless tobacco: Never  Vaping Use  Vaping Use: Never used  Substance and Sexual Activity   Alcohol use: Yes    Comment: not now but has abused it the past and episodic binge    Drug use: Not Currently    Types: Methamphetamines, Cocaine, Marijuana    Comment: Substance use since 41 yo    Sexual activity: Yes  Other Topics Concern   Not on file  Social History Narrative   Lives with a friend . He has 2 children and 2 grandkids .    Social Determinants of Health   Financial Resource Strain: Not on file  Food Insecurity: Not on file  Transportation Needs: Not on file  Physical Activity: Not on file  Stress: Not on file  Social Connections: Not on file    Additional Social History:                         Sleep: Fair  Appetite:  Good  Current Medications: Current Facility-Administered Medications  Medication Dose Route Frequency Provider Last Rate Last Admin   acetaminophen (TYLENOL) tablet 650 mg  650 mg Oral Q6H PRN Jaclyn Shaggy, PA-C   650 mg at 02/21/21 1326   alum & mag hydroxide-simeth (MAALOX/MYLANTA) 200-200-20 MG/5ML suspension 30 mL  30 mL Oral Q4H PRN Ladona Ridgel, Cody W, PA-C       amLODipine (NORVASC) tablet 5 mg  5 mg Oral Daily Janecia Palau, MD   5 mg at 02/22/21 0747   dextromethorphan-guaiFENesin (MUCINEX DM) 30-600 MG per 12 hr tablet 1 tablet  1 tablet Oral BID Eliseo Gum B, MD   1 tablet at 02/22/21 0747   fluticasone (FLONASE) 50 MCG/ACT nasal spray 1 spray  1 spray Each Nare Daily Amarianna Abplanalp, Harrold Donath, MD   1 spray at 02/22/21 0747   hydrOXYzine (ATARAX) tablet 25 mg  25 mg Oral TID PRN Jaclyn Shaggy, PA-C   25 mg at 02/20/21 8546   ibuprofen (ADVIL) tablet 400 mg  400 mg Oral Q6H PRN Phineas Inches, MD   400 mg at 02/18/21 1514   OLANZapine zydis (ZYPREXA) disintegrating tablet 5 mg  5 mg Oral Q8H PRN Bobbye Morton, MD       And   LORazepam (ATIVAN) tablet 1 mg  1 mg Oral PRN Bobbye Morton, MD       And   ziprasidone (GEODON) injection 20 mg  20 mg Intramuscular PRN Eliseo Gum B, MD       magnesium hydroxide (MILK OF MAGNESIA) suspension 30 mL  30 mL Oral Daily PRN Ladona Ridgel, Cody W, PA-C       sertraline (ZOLOFT) tablet 100 mg  100 mg Oral Daily Eliseo Gum B, MD   100 mg at 02/22/21 0747   traZODone (DESYREL) tablet 50 mg  50 mg Oral QHS PRN Jaclyn Shaggy, PA-C        Lab Results: No results found for this or any previous visit (from the past 48 hour(s)).  Blood Alcohol level:  Lab Results  Component Value Date   ETH <10 02/16/2021   ETH <10 08/05/2020    Metabolic Disorder Labs: Lab Results  Component Value Date   HGBA1C 5.7 (H) 02/17/2021   MPG 116.89 02/17/2021   No  results found for: PROLACTIN Lab Results  Component Value Date   CHOL 163 02/17/2021   TRIG 183 (H) 02/17/2021   HDL 40 (L) 02/17/2021   CHOLHDL 4.1 02/17/2021   VLDL 37 02/17/2021   LDLCALC  86 02/17/2021   LDLCALC 114 (H) 08/26/2019    Physical Findings: AIMS: Facial and Oral Movements Muscles of Facial Expression: None, normal Lips and Perioral Area: None, normal Jaw: None, normal Tongue: None, normal,Extremity Movements Upper (arms, wrists, hands, fingers): None, normal Lower (legs, knees, ankles, toes): None, normal, Trunk Movements Neck, shoulders, hips: None, normal, Overall Severity Severity of abnormal movements (highest score from questions above): None, normal Incapacitation due to abnormal movements: None, normal Patient's awareness of abnormal movements (rate only patient's report): No Awareness, Dental Status Current problems with teeth and/or dentures?: No Does patient usually wear dentures?: No  CIWA:    COWS:     Musculoskeletal: Strength & Muscle Tone: within normal limits Gait & Station: normal Patient leans: N/A  Psychiatric Specialty Exam:  Presentation  General Appearance: Appropriate for Environment  Eye Contact:Fair  Speech:Clear and Coherent  Speech Volume:Normal  Handedness:No data recorded  Mood and Affect  Mood:Dysphoric (but motivated)  Affect:Congruent   Thought Process  Thought Processes:Coherent  Descriptions of Associations:Intact  Orientation:Full (Time, Place and Person)  Thought Content:Logical  History of Schizophrenia/Schizoaffective disorder:No  Duration of Psychotic Symptoms:No data recorded Hallucinations:Hallucinations: None  Ideas of Reference:None  Suicidal Thoughts:Suicidal Thoughts: No  Homicidal Thoughts:Homicidal Thoughts: No   Sensorium  Memory:Immediate Good; Recent Good; Remote Good  Judgment:-- (IMproving)  Insight:-- (Improving)   Executive Functions  Concentration:Good  Attention  Span:Good  Recall:No data recorded Fund of Knowledge:Good  Language:Good   Psychomotor Activity  Psychomotor Activity:Psychomotor Activity: Normal   Assets  Assets:Desire for Improvement; Resilience   Sleep  Sleep:Sleep: Fair    Physical Exam: Physical Exam Constitutional:      Appearance: Normal appearance.  HENT:     Head: Normocephalic and atraumatic.  Pulmonary:     Effort: Pulmonary effort is normal.  Neurological:     Mental Status: He is alert and oriented to person, place, and time.   Review of Systems  Psychiatric/Behavioral:  Negative for hallucinations and suicidal ideas.   Blood pressure 127/89, pulse (!) 116, temperature 97.8 F (36.6 C), resp. rate 18, height 5\' 11"  (1.803 m), weight 91.8 kg, SpO2 99 %. Body mass index is 28.23 kg/m.   Treatment Plan Summary: Daily contact with patient to assess and evaluate symptoms and progress in treatment and Medication management Mekiah Cranmer is a 41 yo patient w/ MDD and substance use that endorsed SI on presentation to [facility] Patient is reaching out and following through (with significant encouragement) to find dispo. Thus far patient does not appear to be having adverse side effects from his Zoloft and does appear to be benefiting. Despite patient endorsing feeling a bit "cloudy" patient did well on DOWB and calculating change.   MDD, recurrent, severe w/o psychotic features Recent psychotic symptoms- resolved -Continue Zoloft  to 100mg  daily   Congestion - Continue Flonase daily - Continue Mucinex DM 30-600mg  BID       PRN -Tylenol 650mg  q6h, pain - Advil 400mg  q6h  -Maalox 57ml q4h, indigestion -Atarax 25mg  TID, anxiety -Milk of Mag 74mL, constipation -Trazodone 50mg  QHS, insomnia  Agitation protocol: Zyprexa 5mg  q8h, Ativan 1mg , Geodon 20mg    Stimulant use disorder, severe, amphetamine type substance - SW consult, patient reaching out to programs and services   PGY-2 Freida Busman,  MD 02/22/2021, 4:24 PM Total Time Spent in Direct Patient Care:  I personally spent 20 minutes on the unit in direct patient care. The direct patient care time included face-to-face time with the patient, reviewing the patient's  chart, communicating with other professionals, and coordinating care. Greater than 50% of this time was spent in counseling or coordinating care with the patient regarding goals of hospitalization, psycho-education, and discharge planning needs.  I have independently evaluated the patient during a face-to-face assessment on 02/22/21. I reviewed the patient's chart, and I participated in key portions of the service. I discussed the case with the [employer], and I agree with the assessment and plan of care as documented in the House Officer's note, as addended by me or notated below:  I directly edited the note, as above. Pt's mood and depressive symptoms have stabilized. He denies having suicidal thoughts. Pt is future oriented. Denies s/e to medication and denies any other somatic complaints. Pt reports that clouding thinking cleared during the morning and no longer has that complaint on my assessment. Pt is agreeable to go to [facility] [place] after dc.  Plan for dc tomorrow morning.   Janine Limbo, MD Psychiatrist

## 2021-02-22 NOTE — Progress Notes (Signed)
°   02/22/21 2005  Psych Admission Type (Psych Patients Only)  Admission Status Voluntary  Psychosocial Assessment  Patient Complaints None  Eye Contact Fair  Facial Expression Other (Comment) (appropriate)  Affect Appropriate to circumstance  Speech Logical/coherent  Interaction Assertive  Motor Activity Other (Comment) (wnl)  Appearance/Hygiene Unremarkable;In scrubs  Behavior Characteristics Cooperative;Appropriate to situation  Mood Pleasant  Thought Process  Coherency Concrete thinking  Content WDL  Delusions None reported or observed  Perception WDL  Hallucination None reported or observed  Judgment Poor  Confusion None  Danger to Self  Current suicidal ideation? Denies  Danger to Others  Danger to Others None reported or observed   Pt seen in bed. Pt denies SI, HI, AVH and pain. Pt denies anxiety and depression. Pt states that he is ready to leave tomorrow. States that he wants to start over in [place]. "I'm supposed to be going to a house there." Pt very upbeat. States his sleep is good and has been attending groups. Says appetite is also good. One complaint is too much salt in food served lately.

## 2021-02-22 NOTE — Progress Notes (Signed)
Progress note  Pt requesting discharge. Pt denies all for this Clinical research associate.    02/22/21 0747  Psych Admission Type (Psych Patients Only)  Admission Status Voluntary  Psychosocial Assessment  Patient Complaints Anxiety  Eye Contact Fair  Facial Expression Anxious  Affect Anxious;Depressed  Speech Logical/coherent  Interaction Assertive  Motor Activity Slow  Appearance/Hygiene Unremarkable  Behavior Characteristics Cooperative;Appropriate to situation;Anxious  Mood Depressed;Anxious;Sad;Pleasant  Thought Administrator, sports thinking  Content WDL  Delusions Persecutory  Perception WDL  Hallucination None reported or observed  Judgment Poor  Confusion None  Danger to Self  Current suicidal ideation? Denies  Self-Injurious Behavior No self-injurious ideation or behavior indicators observed or expressed   Danger to Others  Danger to Others None reported or observed

## 2021-02-22 NOTE — [facility] Group Notes (Signed)
[facility] Group Notes:  (Nursing/MHT/Case Management/Adjunct)  Date:  02/22/2021  Time:  9:40 PM  Type of Therapy:   Wrap Up Group  Participation Level:  Active  Participation Quality:  Appropriate  Affect:  Appropriate  Cognitive:  Alert and Appropriate  Insight:  Appropriate, Good, and Improving  Engagement in Group:  Developing/Improving  Modes of Intervention:  Discussion  Summary of Progress/Problems: PT came to group in a good mood. PT expressed how being here has helped him be more vulnerable and speak about his hardship.  Lorita Officer 02/22/2021, 9:40 PM

## 2021-02-23 ENCOUNTER — Encounter (HOSPITAL_COMMUNITY): Payer: Self-pay

## 2021-02-23 NOTE — Plan of Care (Signed)
Discharge note  Patient verbalizes readiness for discharge. Follow up plan explained, AVS, Transition record and SRA given. Prescriptions and teaching provided. Belongings returned and signed for. Suicide safety plan completed and signed. Patient verbalizes understanding. Patient denies SI/HI and assures this Probation officer they will seek assistance should that change. Patient discharged to lobby where safe transport was waiting.  Problem: Education: Goal: Utilization of techniques to improve thought processes will improve Outcome: Adequate for Discharge Goal: Knowledge of the prescribed therapeutic regimen will improve Outcome: Adequate for Discharge   Problem: Activity: Goal: Interest or engagement in leisure activities will improve Outcome: Adequate for Discharge Goal: Imbalance in normal sleep/wake cycle will improve Outcome: Adequate for Discharge   Problem: Coping: Goal: Coping ability will improve Outcome: Adequate for Discharge Goal: Will verbalize feelings Outcome: Adequate for Discharge   Problem: Health Behavior/Discharge Planning: Goal: Ability to make decisions will improve Outcome: Adequate for Discharge Goal: Compliance with therapeutic regimen will improve Outcome: Adequate for Discharge   Problem: Role Relationship: Goal: Will demonstrate positive changes in social behaviors and relationships Outcome: Adequate for Discharge   Problem: Safety: Goal: Ability to disclose and discuss suicidal ideas will improve Outcome: Adequate for Discharge Goal: Ability to identify and utilize support systems that promote safety will improve Outcome: Adequate for Discharge   Problem: Self-Concept: Goal: Will verbalize positive feelings about self 02/23/2021 1047 by Baron Sane, RN Outcome: Adequate for Discharge 02/23/2021 0826 by Baron Sane, RN Outcome: Progressing Goal: Level of anxiety will decrease 02/23/2021 1047 by Baron Sane, RN Outcome: Adequate for  Discharge 02/23/2021 0826 by Baron Sane, RN Outcome: Progressing   Problem: Education: Goal: Knowledge of [place] Education information/materials will improve 02/23/2021 1047 by Baron Sane, RN Outcome: Adequate for Discharge 02/23/2021 [id number] by Baron Sane, RN Outcome: Progressing Goal: Emotional status will improve Outcome: Adequate for Discharge Goal: Mental status will improve Outcome: Adequate for Discharge Goal: Verbalization of understanding the information provided will improve Outcome: Adequate for Discharge   Problem: Activity: Goal: Interest or engagement in activities will improve Outcome: Adequate for Discharge Goal: Sleeping patterns will improve Outcome: Adequate for Discharge   Problem: Coping: Goal: Ability to verbalize frustrations and anger appropriately will improve Outcome: Adequate for Discharge Goal: Ability to demonstrate self-control will improve Outcome: Adequate for Discharge   Problem: Health Behavior/Discharge Planning: Goal: Identification of resources available to assist in meeting health care needs will improve Outcome: Adequate for Discharge Goal: Compliance with treatment plan for underlying cause of condition will improve Outcome: Adequate for Discharge   Problem: Physical Regulation: Goal: Ability to maintain clinical measurements within normal limits will improve Outcome: Adequate for Discharge   Problem: Safety: Goal: Periods of time without injury will increase Outcome: Adequate for Discharge

## 2021-02-23 NOTE — BH IP Treatment Plan (Signed)
Interdisciplinary Treatment and Diagnostic Plan Update  02/23/2021 Time of Session: 10:10am Stedmon Hibdon MRN: [id number]  Principal Diagnosis: MDD (major depressive disorder), recurrent, severe, with psychosis ([place])  Secondary Diagnoses: Principal Problem:   MDD (major depressive disorder), recurrent, severe, with psychosis ([place]) Active Problems:   Stimulant use disorder   Current Medications:  Current Facility-Administered Medications  Medication Dose Route Frequency Provider Last Rate Last Admin   acetaminophen (TYLENOL) tablet 650 mg  650 mg Oral Q6H PRN Prescilla Sours, PA-C   650 mg at 02/22/21 1640   alum & mag hydroxide-simeth (MAALOX/MYLANTA) 200-200-20 MG/5ML suspension 30 mL  30 mL Oral Q4H PRN Lovena Le, Cody W, PA-C       amLODipine (NORVASC) tablet 5 mg  5 mg Oral Daily Massengill, Nathan, MD   5 mg at 02/23/21 0749   dextromethorphan-guaiFENesin ([place] DM) 30-600 MG per 12 hr tablet 1 tablet  1 tablet Oral BID Damita Dunnings B, MD   1 tablet at 02/23/21 0749   fluticasone (FLONASE) 50 MCG/ACT nasal spray 1 spray  1 spray Each Nare Daily Massengill, Ovid Curd, MD   1 spray at 02/23/21 0749   hydrOXYzine (ATARAX) tablet 25 mg  25 mg Oral TID PRN Prescilla Sours, PA-C   25 mg at 02/20/21 [id number]   ibuprofen (ADVIL) tablet 400 mg  400 mg Oral Q6H PRN Janine Limbo, MD   400 mg at 02/18/21 1514   OLANZapine zydis (ZYPREXA) disintegrating tablet 5 mg  5 mg Oral Q8H PRN Freida Busman, MD       And   LORazepam (ATIVAN) tablet 1 mg  1 mg Oral PRN Freida Busman, MD       And   ziprasidone (GEODON) injection 20 mg  20 mg Intramuscular PRN Damita Dunnings B, MD       magnesium hydroxide (MILK OF MAGNESIA) suspension 30 mL  30 mL Oral Daily PRN Lovena Le, Cody W, PA-C       sertraline (ZOLOFT) tablet 100 mg  100 mg Oral Daily Damita Dunnings B, MD   100 mg at 02/23/21 0749   traZODone (DESYREL) tablet 50 mg  50 mg Oral QHS PRN Prescilla Sours, PA-C       PTA Medications: No medications prior  to admission.    Patient Stressors: Marital or family conflict   Occupational concerns   Substance abuse    Patient Strengths: Ability for insight  Average or above average intelligence  Motivation for treatment/growth   Treatment Modalities: Medication Management, Group therapy, Case management,  1 to 1 session with clinician, Psychoeducation, Recreational therapy.   Physician Treatment Plan for Primary Diagnosis: MDD (major depressive disorder), recurrent, severe, with psychosis ([place]) Long Term Goal(s): Improvement in symptoms so as ready for discharge   Short Term Goals: Ability to identify changes in lifestyle to reduce recurrence of condition will improve Ability to verbalize feelings will improve Ability to disclose and discuss suicidal ideas Ability to demonstrate self-control will improve Ability to identify and develop effective coping behaviors will improve Ability to maintain clinical measurements within normal limits will improve Ability to identify triggers associated with substance abuse/mental health issues will improve  Medication Management: Evaluate patient's response, side effects, and tolerance of medication regimen.  Therapeutic Interventions: 1 to 1 sessions, Unit Group sessions and Medication administration.  Evaluation of Outcomes: Adequate for Discharge  Physician Treatment Plan for Secondary Diagnosis: Principal Problem:   MDD (major depressive disorder), recurrent, severe, with psychosis ([place]) Active Problems:   Stimulant  use disorder  Long Term Goal(s): Improvement in symptoms so as ready for discharge   Short Term Goals: Ability to identify changes in lifestyle to reduce recurrence of condition will improve Ability to verbalize feelings will improve Ability to disclose and discuss suicidal ideas Ability to demonstrate self-control will improve Ability to identify and develop effective coping behaviors will improve Ability to maintain clinical  measurements within normal limits will improve Ability to identify triggers associated with substance abuse/mental health issues will improve     Medication Management: Evaluate patient's response, side effects, and tolerance of medication regimen.  Therapeutic Interventions: 1 to 1 sessions, Unit Group sessions and Medication administration.  Evaluation of Outcomes: Adequate for Discharge   RN Treatment Plan for Primary Diagnosis: MDD (major depressive disorder), recurrent, severe, with psychosis ([place]) Long Term Goal(s): Knowledge of disease and therapeutic regimen to maintain health will improve  Short Term Goals: Ability to remain free from injury will improve, Ability to verbalize frustration and anger appropriately will improve, Ability to identify and develop effective coping behaviors will improve, and Compliance with prescribed medications will improve  Medication Management: RN will administer medications as ordered by provider, will assess and evaluate patient's response and provide education to patient for prescribed medication. RN will report any adverse and/or side effects to prescribing provider.  Therapeutic Interventions: 1 on 1 counseling sessions, Psychoeducation, Medication administration, Evaluate responses to treatment, Monitor vital signs and CBGs as ordered, Perform/monitor CIWA, COWS, AIMS and Fall Risk screenings as ordered, Perform wound care treatments as ordered.  Evaluation of Outcomes: Adequate for Discharge   LCSW Treatment Plan for Primary Diagnosis: MDD (major depressive disorder), recurrent, severe, with psychosis ([place]) Long Term Goal(s): Safe transition to appropriate next level of care at discharge, Engage patient in therapeutic group addressing interpersonal concerns.  Short Term Goals: Engage patient in aftercare planning with referrals and resources, Increase social support, Increase ability to appropriately verbalize feelings, Identify triggers  associated with mental health/substance abuse issues, and Increase skills for wellness and recovery  Therapeutic Interventions: Assess for all discharge needs, 1 to 1 time with Social worker, Explore available resources and support systems, Assess for adequacy in community support network, Educate family and significant other(s) on suicide prevention, Complete Psychosocial Assessment, Interpersonal group therapy.  Evaluation of Outcomes: Adequate for Discharge   Progress in Treatment: Attending groups: Yes. Participating in groups: Yes. Taking medication as prescribed: Yes. Toleration medication: Yes. Family/Significant other contact made: No, will contact: Declined consents Patient understands diagnosis: Yes. Discussing patient identified problems/goals with staff: Yes. Medical problems stabilized or resolved: Yes. Denies suicidal/homicidal ideation: Yes. Issues/concerns per patient self-inventory: No. Other: None   New problem(s) identified: No, Describe:  None   New Short Term/Long Term Goal(s):medication stabilization, elimination of SI thoughts, development of comprehensive mental wellness plan.    Patient Goals:  Did Not Attend   Discharge Plan or Barriers: Patient is to discharge to [facility]   Reason for Continuation of Hospitalization: Medication stabilization    Estimated Length of Stay: 1 day     Scribe for Treatment Team: Vassie Moselle, LCSW 02/23/2021 10:23 AM

## 2021-02-23 NOTE — Plan of Care (Signed)
°  Problem: Self-Concept: Goal: Will verbalize positive feelings about self Outcome: Progressing Goal: Level of anxiety will decrease Outcome: Progressing   Problem: Education: Goal: Knowledge of [facility] Education information/materials will improve Outcome: Progressing   

## 2021-02-23 NOTE — Discharge Summary (Signed)
Physician Discharge Summary Note  Patient:  Andrew Parsons is an 41 y.o., male MRN:  [id number] DOB:  21-Jun-1980 Patient phone:  [phone] (home)  Patient address:   [place] [address],  Total Time spent with patient: 20 minutes  Date of Admission:  02/17/2021 Date of Discharge: 02/23/2021  Reason for Admission:  Andrew Parsons is a 45 yo patient with no PPH documented in his EMR who presented to [facility] voluntarily endorsing SI and was transferred to [facility].     Principal Problem: MDD (major depressive disorder), recurrent, severe, with psychosis ([place]) Discharge Diagnoses: Principal Problem:   MDD (major depressive disorder), recurrent, severe, with psychosis ([place]) Active Problems:   Stimulant use disorder   Past Psychiatric History: None documented. No prior inpt, OP. Or medications.  Patient reports approx 3 mon ago he thought about jumping from a bridge after an argument with his partner. Patient reports that the police saw patient on the bridge (where he had been having the argument) and talked to patient for 3 hours and also called their mental health personnel.   Past Medical History:  Past Medical History:  Diagnosis Date   Alcohol abuse    Anxiety 08/26/2019   Atypical syncope 08/26/2019   Depression    Fainting spell    Fatigue 08/26/2019   Hyperlipidemia    Hypertension    Polysubstance (excluding opioids) dependence ([place]) 08/26/2019   Sleep disturbance 08/26/2019    Past Surgical History:  Procedure Laterality Date   TYMPANOPLASTY     Family History:  Family History  Problem Relation Age of Onset   Early death Mother    Stroke Mother    Depression Daughter    Hyperlipidemia Daughter    Family Psychiatric  History:  Paternal 49 [address] cousin: schizophrenia Social History:  Social History   Substance and Sexual Activity  Alcohol Use Yes   Comment: not now but has abused it the past and episodic binge      Social History   Substance and Sexual Activity   Drug Use Not Currently   Types: Methamphetamines, Cocaine, Marijuana   Comment: Substance use since 41 yo     Social History   Socioeconomic History   Marital status: Single    Spouse name: Not on file   Number of children: Not on file   Years of education: Not on file   Highest education level: GED or equivalent  Occupational History   Occupation: Printmaker   Tobacco Use   Smoking status: Every Day    Packs/day: 0.50    Types: Cigarettes   Smokeless tobacco: Never  Vaping Use   Vaping Use: Never used  Substance and Sexual Activity   Alcohol use: Yes    Comment: not now but has abused it the past and episodic binge    Drug use: Not Currently    Types: Methamphetamines, Cocaine, Marijuana    Comment: Substance use since 41 yo    Sexual activity: Yes  Other Topics Concern   Not on file  Social History Narrative   Lives with a friend . He has 2 children and 2 grandkids .    Social Determinants of Health   Financial Resource Strain: Not on file  Food Insecurity: Not on file  Transportation Needs: Not on file  Physical Activity: Not on file  Stress: Not on file  Social Connections: Not on file    Hospital Course:  Andrew Parsons is a 41 yo patient w/ MDD and substance use  that endorsed SI on presentation to [facility].  HOSPITAL COURSE:  During the patient's hospitalization, patient had extensive initial psychiatric evaluation, and follow-up psychiatric evaluations every day.  Psychiatric diagnoses provided upon initial assessment:  MDD, recurrent, severe w/o psychotic features Stimulant use disorder, severe, amphetamine type substance Patient's psychiatric medications were adjusted on admission: No prior medications  During the hospitalization, other adjustments were made to the patient's psychiatric medication regimen:  Patient started on Zoloft 25mg  daily and titrated up to 100mg   Patient started on Amlodipine 2.5mg  and titrated up to 5mg  for HTN Patient required  no PRNs for behavior or agitation.  Patient's care was discussed during the interdisciplinary team meeting every day during the hospitalization.  The patient denied having side effects to prescribed psychiatric medication.  Gradually, patient started adjusting to milieu. The patient was evaluated each day by a clinical provider to ascertain response to treatment. Improvement was noted by the patient's report of decreasing symptoms, improved sleep and appetite, affect, medication tolerance, behavior, and participation in unit programming.  Patient was asked each day to complete a self inventory noting mood, mental status, pain, new symptoms, anxiety and concerns.    Symptoms were reported as resolved completely by discharge.  The patient reports that their mood is stable and that he is feeling more motivated.   The patient denied having suicidal thoughts for more than 48 hours prior to discharge.  Patient denies having homicidal thoughts.  Patient denies having auditory hallucinations.  Patient denies any visual hallucinations or other symptoms of psychosis.  The patient was motivated to continue taking medication with a goal of continued improvement in mental health.   The patient reports their target psychiatric symptoms of  depression, anxiety, and suicidal thoughts all responded well to the psychiatric medications, and the patient reports overall benefit other psychiatric hospitalization. Supportive psychotherapy was provided to the patient. The patient also participated in regular group therapy while hospitalized. Coping skills, problem solving as well as relaxation therapies were also part of the unit programming.  Labs were reviewed with the patient, and abnormal results were discussed with the patient.  The patient is able to verbalize their individual safety plan to this provider.  # It is recommended to the patient to continue psychiatric medications as prescribed, after discharge from  the hospital.    # It is recommended to the patient to follow up with your outpatient psychiatric provider and PCP.  # It was discussed with the patient, the impact of alcohol, drugs, tobacco have been there overall psychiatric and medical wellbeing, and total abstinence from substance use was recommended the patient.  # Prescriptions provided with coupon. Patient agreeable to plan. Given opportunity to ask questions. Appears to feel comfortable with discharge.     # Patient was discharged to safe transport to [facility] with a plan to follow up as noted below.   At discharge patient denies SI, HI, ans AVH. Patient endorses that he was both eating and sleeping well. Patient was educated on 988 and local ED in the [place] area where he will be going. Patient endorsed that he had significantly benefited from his hospitalization as he was finally able to feel his mind was more clear and he was able to prioritize what he hopes to do in his life. Prior to discharge patient reached out to his brother, which on discharge he had endorsed was too difficult. Patient learned during his hospitalization that having a support system is beneficial and he endorses that  this is something he will continue to work towards.   Physical Findings: AIMS: Facial and Oral Movements Muscles of Facial Expression: None, normal Lips and Perioral Area: None, normal Jaw: None, normal Tongue: None, normal,Extremity Movements Upper (arms, wrists, hands, fingers): None, normal Lower (legs, knees, ankles, toes): None, normal, Trunk Movements Neck, shoulders, hips: None, normal, Overall Severity Severity of abnormal movements (highest score from questions above): None, normal Incapacitation due to abnormal movements: None, normal Patient's awareness of abnormal movements (rate only patient's report): No Awareness, Dental Status Current problems with teeth and/or dentures?: No Does patient usually wear dentures?: No  CIWA:     COWS:     Musculoskeletal: Strength & Muscle Tone: within normal limits Gait & Station: normal Patient leans: N/A   Psychiatric Specialty Exam:  Presentation  General Appearance: Appropriate for Environment  Eye Contact:Good  Speech:Clear and Coherent  Speech Volume:Normal  Handedness:No data recorded  Mood and Affect  Mood:Euthymic  Affect:Congruent; Appropriate   Thought Process  Thought Processes:Coherent  Descriptions of Associations:Intact  Orientation:Full (Time, Place and Person)  Thought Content:Logical  History of Schizophrenia/Schizoaffective disorder:No  Duration of Psychotic Symptoms:No data recorded Hallucinations:Hallucinations: None  Ideas of Reference:None  Suicidal Thoughts:Suicidal Thoughts: No  Homicidal Thoughts:Homicidal Thoughts: No   Sensorium  Memory:Immediate Good; Recent Good; Remote Good  Judgment:Fair  Insight:Fair   Executive Functions  Concentration:Good  Attention Span:Good  Recall:No data recorded Fund of Knowledge:Good  Language:Good   Psychomotor Activity  Psychomotor Activity:Psychomotor Activity: Normal   Assets  Assets:Communication Skills; Resilience; Desire for Improvement   Sleep  Sleep:Sleep: Good    Physical Exam: Physical Exam Constitutional:      Appearance: Normal appearance.  HENT:     Head: Normocephalic and atraumatic.  Pulmonary:     Effort: Pulmonary effort is normal.  Neurological:     Mental Status: He is alert and oriented to person, place, and time.   Review of Systems  Psychiatric/Behavioral:  Negative for depression, hallucinations and suicidal ideas.   Blood pressure 128/89, pulse 95, temperature 98.2 F (36.8 C), temperature source Oral, resp. rate 18, height 5\' 11"  (1.803 m), weight 91.8 kg, SpO2 96 %. Body mass index is 28.23 kg/m.   Social History   Tobacco Use  Smoking Status Every Day   Packs/day: 0.50   Types: Cigarettes  Smokeless Tobacco Never    Tobacco Cessation:  Prescription not provided because: patient does not use enough tobacco products and does not require   Blood Alcohol level:  Lab Results  Component Value Date   ETH <10 02/16/2021   ETH <10 [id number]    Metabolic Disorder Labs:  Lab Results  Component Value Date   HGBA1C 5.7 (H) 02/17/2021   MPG 116.89 02/17/2021   No results found for: PROLACTIN Lab Results  Component Value Date   CHOL 163 02/17/2021   TRIG 183 (H) 02/17/2021   HDL 40 (L) 02/17/2021   CHOLHDL 4.1 02/17/2021   VLDL 37 02/17/2021   LDLCALC 86 02/17/2021   LDLCALC 114 (H) 08/26/2019    See Psychiatric Specialty Exam and Suicide Risk Assessment completed by Attending Physician prior to discharge.  Discharge destination:  Other:  [facility]  Is patient on multiple antipsychotic therapies at discharge:  No   Has Patient had three or more failed trials of antipsychotic monotherapy by history:  No  Recommended Plan for Multiple Antipsychotic Therapies: NA  Discharge Instructions     Diet - low sodium heart healthy   Complete by: As  directed    Increase activity slowly   Complete by: As directed    Increase activity slowly   Complete by: As directed       Allergies as of 02/23/2021   No Known Allergies      Medication List     TAKE these medications      Indication  amLODipine 5 MG tablet Commonly known as: NORVASC Take 1 tablet (5 mg total) by mouth daily.  Indication: High Blood Pressure Disorder   sertraline 100 MG tablet Commonly known as: ZOLOFT Take 1 tablet (100 mg total) by mouth daily.  Indication: Major Depressive Disorder        Follow-up Information     [facility] Follow up.   Why: Please go to this facility Monday, Wednesday or Thursday from 8:30am-5pm as a walk-in to become established with medication management and therapy services. Contact information: [address], [address] [place], [place] [address] [phone]         [facility] Follow up.   Why: To be accepted for residential substance use treatment, please go to this facility Monday- Friday between the hours of 7:30am and 3:30pm. Contact information: [address], [place], [place] [address] [phone]                Follow-up recommendations:  Follow up recommendations: - Activity as tolerated. - Diet as recommended by PCP. - Keep all scheduled follow-up appointments as recommended.   Comments:  Patient is instructed to take all prescribed medications as recommended. Report any side effects or adverse reactions to your outpatient psychiatrist. Patient is instructed to abstain from alcohol and illegal drugs while on prescription medications. In the event of worsening symptoms, patient is instructed to call the crisis hotline, 911, or go to the nearest emergency department for evaluation and treatment.    Signed:  PGY-2 Freida Busman, MD 02/23/2021, 5:18 PM

## 2021-02-23 NOTE — BHH Suicide Risk Assessment (Addendum)
Suicide Risk Assessment  Discharge Assessment    [facility] Discharge Suicide Risk Assessment   Principal Problem: MDD (major depressive disorder), recurrent, severe, with psychosis ([place]) Discharge Diagnoses: Principal Problem:   MDD (major depressive disorder), recurrent, severe, with psychosis ([place]) Active Problems:   Stimulant use disorder   Total Time spent with patient: 15 minutes Andrew Parsons is a 41 yo patient w/ MDD and substance use that endorsed SI on presentation to [facility].   During the patient's hospitalization, patient had extensive initial psychiatric evaluation, and follow-up psychiatric evaluations every day.  Psychiatric diagnoses provided upon initial assessment:  MDD, recurrent, severe w/o psychotic features Stimulant use disorder, severe, amphetamine type substance  Patient's psychiatric medications were adjusted on admission:  - Start Zoloft 25mg  daily  During the hospitalization, other adjustments were made to the patient's psychiatric medication regimen:  -zoloft was increased to 100 mg once daily -amlodipine was started at 5 mg once daily   Gradually, patient started adjusting to milieu.   Patient's care was discussed during the interdisciplinary team meeting every day during the hospitalization.  The patient denied having side effects to prescribed psychiatric medication.  The patient reports their target psychiatric symptoms of depression, anxiety, and suicidal thoughts, all responded well to the psychiatric medications, and the patient reports overall benefit other psychiatric hospitalization. Supportive psychotherapy was provided to the patient. The patient also participated in regular group therapy while admitted.   Labs were reviewed with the patient, and abnormal results were discussed with the patient.  The patient denied having suicidal thoughts more than 48 hours prior to discharge.  Patient denies having homicidal thoughts.  Patient denies having  auditory hallucinations.  Patient denies any visual hallucinations.  Patient denies having paranoid thoughts.  The patient is able to verbalize their individual safety plan to this provider.  It is recommended to the patient to continue psychiatric medications as prescribed, after discharge from the hospital.    It is recommended to the patient to follow up with your outpatient psychiatric provider and PCP.  Discussed with the patient, the impact of alcohol, drugs, tobacco have been there overall psychiatric and medical wellbeing, and total abstinence from substance use was recommended the patient.  Patient was educated on 988 and local ED in the [place] area where he will be going. Patient endorsed that he had significantly benefited from his hospitalization as he was finally able to feel his mind was more clear and he was able to prioritize what he hopes to do in his life. Prior to discharge patient reached out to his brother, which on discharge he had endorsed was too difficult. Patient learned during his hospitalization that having a support system is beneficial and he endorses that this is something he will continue to work towards.     Musculoskeletal: Strength & Muscle Tone: within normal limits Gait & Station: normal Patient leans: N/A  Psychiatric Specialty Exam  Presentation  General Appearance: Appropriate for Environment  Eye Contact:Fair  Speech:Clear and Coherent  Speech Volume:Normal  Handedness:No data recorded  Mood and Affect  Mood:euthymic   Duration of Depression Symptoms: Greater than two weeks  Affect:Congruent   Thought Process  Thought Processes:Coherent  Descriptions of Associations:Intact  Orientation:Full (Time, Place and Person)  Thought Content:Logical  History of Schizophrenia/Schizoaffective disorder:No  Duration of Psychotic Symptoms:No data recorded Hallucinations:Hallucinations: None  Ideas of Reference:None  Suicidal  Thoughts:Suicidal Thoughts: No  Homicidal Thoughts:Homicidal Thoughts: No   Sensorium  Memory:Immediate Good; Recent Good; Remote Good  Judgment:-- (IMproving)  Insight:-- (Improving)   Executive Functions  Concentration:Good  Attention Span:Good  Recall:No data recorded Fund of Knowledge:Good  Language:Good   Psychomotor Activity  Psychomotor Activity:Psychomotor Activity: Normal   Assets  Assets:Desire for Improvement; Resilience   Sleep  Sleep:Sleep: Fair   Physical Exam: Physical Exam Vitals reviewed.  Constitutional:      Appearance: Normal appearance.  HENT:     Head: Normocephalic and atraumatic.  Pulmonary:     Effort: Pulmonary effort is normal.  Neurological:     Mental Status: He is alert and oriented to person, place, and time.   Review of Systems  Constitutional:  Negative for chills and fever.  Cardiovascular:  Negative for chest pain and palpitations.  Neurological:  Negative for dizziness, tingling, tremors and headaches.  Psychiatric/Behavioral:  Negative for depression, hallucinations, memory loss, substance abuse and suicidal ideas. The patient is not nervous/anxious and does not have insomnia.   All other systems reviewed and are negative.  Blood pressure 128/89, pulse 95, temperature 98.2 F (36.8 C), temperature source Oral, resp. rate 18, height 5\' 11"  (1.803 m), weight 91.8 kg, SpO2 96 %. Body mass index is 28.23 kg/m.  Mental Status Per Nursing Assessment::   On Admission:  Suicidal ideation indicated by patient  Demographic Factors:  Male  Loss Factors: Decrease in vocational status  Historical Factors: Family history of mental illness or substance abuse  Risk Reduction Factors:   Sense of responsibility to family  Continued Clinical Symptoms:  Denies  Cognitive Features That Contribute To Risk:  None    Suicide Risk:  Minimal: No identifiable suicidal ideation.  Patients presenting with no risk factors but  with morbid ruminations; may be classified as minimal risk based on the severity of the depressive symptoms   Follow-up [facility]. Go on 03/10/2021.   Why: You have an assessment appointment on 03/10/21 at 10:30 am for medication management services.  You also have an appointment for therapy services on 03/17/21 at 11:00 am.  The first appointment will be held in person. Contact information: [address], [place], [place] [address]  Phone: [phone] Fax: [phone]        [facility] Follow up.   Why: Please go to this facility Monday, Wednesday or Thursday from 8:30am-5pm as a walk-in to become established with medication management and therapy services. Contact information: [address] [place], [place] [address] [phone]        [facility] Follow up.   Why: To be accepted for residential substance use treatment, please go to this facility Monday- Friday between the hours of 7:30am and 3:30pm. Contact information: [address] [place], [place] [address] [phone]                Activity: as tolerated  Diet: heart healthy  Other: -Follow-up with your outpatient psychiatric provider -instructions on appointment date, time, and address (location) are provided to you in discharge paperwork.  -Take your psychiatric medications as prescribed at discharge - instructions are provided to you in the discharge paperwork  -Follow-up with outpatient primary care doctor and other specialists -for management of chronic medical disease, including: Hypertension  -Testing: Follow-up with outpatient provider for abnormal lab results:  Abnormal lipid panel (not fasting)  HbA1c 5.7  -Recommend abstinence from alcohol, tobacco, and other illicit drug use at discharge.   -If your psychiatric symptoms recur, worsen, or if you have side effects to your psychiatric  medications, call your outpatient psychiatric  provider, 911, 988 or go to the nearest emergency department.  -If suicidal thoughts recur, call your outpatient psychiatric provider, 911, 988 or go to the nearest emergency department.     PGY-2 Freida Busman, MD 02/23/2021, 8:09 AM  Total Time Spent in Direct Patient Care:  I personally spent 45 minutes on the unit in direct patient care. The direct patient care time included face-to-face time with the patient, reviewing the patient's chart, communicating with other professionals, and coordinating care. Greater than 50% of this time was spent in counseling or coordinating care with the patient regarding goals of hospitalization, psycho-education, and discharge planning needs.  On my assessment the patient denied SI, HI, AVH, paranoia, ideas of reference, or first rank symptoms on day of discharge. Patient denied drug cravings or active signs of withdrawal. Patient denied medication side-effects. Patient was not deemed to be a danger to self or others on day of discharge and was in agreement with discharge plans.   I have independently evaluated the patient during a face-to-face assessment on the day of discharge. I reviewed the patient's chart, and I participated in key portions of the service. I discussed the case with the resident physician, and I agree with the assessment and plan of care as documented in the resident physician's note, as addended by me or notated below:  I directly edited the note, as above.   Janine Limbo, MD Psychiatrist

## 2021-02-23 NOTE — Progress Notes (Signed)
°  [facility] Adult Case Management Discharge Plan :  Will you be returning to the same living situation after discharge:  No. At discharge, do you have transportation home?: Yes,  Safe Transport Do you have the ability to pay for your medications: Yes,  private insurance  Release of information consent forms completed and sent to medical records;  Patient's signature obtained.   Patient to Follow up at:  Follow-up Information     [facility]. Go on 03/10/2021.   Why: You have an assessment appointment on 03/10/21 at 10:30 am for medication management services.  You also have an appointment for therapy services on 03/17/21 at 11:00 am.  The first appointment will be held in person. Contact information: [address], [place], [place] [address]  Phone: [phone] Fax: [phone]        [facility] Follow up.   Why: Please go to this facility Monday, Wednesday or Thursday from 8:30am-5pm as a walk-in to become established with medication management and therapy services. Contact information: [address] [place], [place] [address] [phone]        [facility] Follow up.   Why: To be accepted for residential substance use treatment, please go to this facility Monday- Friday between the hours of 7:30am and 3:30pm. Contact information: [address], [place], [place] [address] [phone]                Next level of care provider has access to [facility] Link:no  Safety Planning and Suicide Prevention discussed: Yes,  w/ pt     Has patient been referred to the Quitline?: Patient refused referral  Patient has been referred for addiction treatment: Yes  Felizardo Hoffmann, LCSWA 02/23/2021, 10:11 AM

## 2021-03-01 NOTE — BHH Group Notes (Signed)
Spiritual care group on grief and loss facilitated by chaplain Dyanne Carrel, [facility]   Group Goal:   Support / Education around grief and loss   Members engage in facilitated group support and psycho-social education.   Group Description:   Following introductions and group rules, group members engaged in facilitated group dialog and support around topic of loss, with particular support around experiences of loss in their lives. Group Identified types of loss (relationships / self / things) and identified patterns, circumstances, and changes that precipitate losses. Reflected on thoughts / feelings around loss, normalized grief responses, and recognized variety in grief experience. Group noted Worden's four tasks of grief in discussion.   Group drew on Adlerian / Rogerian, narrative, MI,   Patient Progress: Patient attended group and participated and engaged in conversation.  [address], [facility] Pager, [phone]

## 2021-07-13 ENCOUNTER — Inpatient Hospital Stay: Payer: Self-pay

## 2021-07-13 ENCOUNTER — Emergency Department
Admission: EM | Admit: 2021-07-13 | Discharge: 2021-07-14 | DRG: 917 | Disposition: A | Discharge status: Other Acute Inpatient Hospital | Payer: Self-pay | Attending: Critical Care Medicine | Admitting: Critical Care Medicine

## 2021-07-13 ENCOUNTER — Emergency Department: Payer: Self-pay

## 2021-07-13 ENCOUNTER — Encounter: Payer: Self-pay | Admitting: Emergency Medicine

## 2021-07-13 ENCOUNTER — Other Ambulatory Visit: Payer: Self-pay

## 2021-07-13 DIAGNOSIS — G40901 Epilepsy, unspecified, not intractable, with status epilepticus: Secondary | ICD-10-CM | POA: Diagnosis present

## 2021-07-13 DIAGNOSIS — T405X1A Poisoning by cocaine, accidental (unintentional), initial encounter: Secondary | ICD-10-CM | POA: Diagnosis present

## 2021-07-13 DIAGNOSIS — R000 Tachycardia, unspecified: Secondary | ICD-10-CM | POA: Insufficient documentation

## 2021-07-13 DIAGNOSIS — J9601 Acute respiratory failure with hypoxia: Secondary | ICD-10-CM | POA: Diagnosis present

## 2021-07-13 DIAGNOSIS — N179 Acute kidney failure, unspecified: Secondary | ICD-10-CM | POA: Diagnosis present

## 2021-07-13 DIAGNOSIS — R4182 Altered mental status, unspecified: Secondary | ICD-10-CM | POA: Insufficient documentation

## 2021-07-13 DIAGNOSIS — I952 Hypotension due to drugs: Secondary | ICD-10-CM | POA: Diagnosis present

## 2021-07-13 DIAGNOSIS — R569 Unspecified convulsions: Secondary | ICD-10-CM | POA: Diagnosis present

## 2021-07-13 DIAGNOSIS — I10 Essential (primary) hypertension: Secondary | ICD-10-CM | POA: Diagnosis present

## 2021-07-13 DIAGNOSIS — F17210 Nicotine dependence, cigarettes, uncomplicated: Secondary | ICD-10-CM | POA: Insufficient documentation

## 2021-07-13 DIAGNOSIS — J9819 Other pulmonary collapse: Secondary | ICD-10-CM | POA: Insufficient documentation

## 2021-07-13 DIAGNOSIS — R064 Hyperventilation: Secondary | ICD-10-CM | POA: Insufficient documentation

## 2021-07-13 DIAGNOSIS — R9431 Abnormal electrocardiogram [ECG] [EKG]: Secondary | ICD-10-CM | POA: Insufficient documentation

## 2021-07-13 DIAGNOSIS — Y929 Unspecified place or not applicable: Secondary | ICD-10-CM

## 2021-07-13 LAB — COMPREHENSIVE METABOLIC PANEL
ALT: 33 U/L (ref 0–44)
AST: 34 U/L (ref 15–41)
Albumin: 4.7 g/dL (ref 3.5–5.0)
Alkaline Phosphatase: 71 U/L (ref 38–126)
Anion gap: 11 (ref 5–15)
BUN: 24 mg/dL — ABNORMAL HIGH (ref 6–20)
CO2: 20 mmol/L — ABNORMAL LOW (ref 22–32)
Calcium: 9.3 mg/dL (ref 8.9–10.3)
Chloride: 108 mmol/L (ref 98–111)
Creatinine, Ser: 1.29 mg/dL — ABNORMAL HIGH (ref 0.61–1.24)
GFR, Estimated: >60 mL/min (ref >60)
Glucose, Bld: 120 mg/dL — ABNORMAL HIGH (ref 70–99)
Potassium: 3.2 mmol/L — ABNORMAL LOW (ref 3.5–5.1)
Sodium: 139 mmol/L (ref 135–145)
Total Bilirubin: 1.2 mg/dL (ref 0.3–1.2)
Total Protein: 8.4 g/dL — ABNORMAL HIGH (ref 6.5–8.1)

## 2021-07-13 LAB — CBC WITH DIFFERENTIAL/PLATELET
Abs Immature Granulocytes: 0.03 10*3/uL (ref 0.00–0.07)
Basophils Absolute: 0 10*3/uL (ref 0.0–0.1)
Basophils Relative: 0 %
Eosinophils Absolute: 0.1 10*3/uL (ref 0.0–0.5)
Eosinophils Relative: 1 %
HCT: 44 % (ref 39.0–52.0)
Hemoglobin: 15.1 g/dL (ref 13.0–17.0)
Immature Granulocytes: 0 %
Lymphocytes Relative: 33 %
Lymphs Abs: 3.8 10*3/uL (ref 0.7–4.0)
MCH: 29.7 pg (ref 26.0–34.0)
MCHC: 34.3 g/dL (ref 30.0–36.0)
MCV: 86.4 fL (ref 80.0–100.0)
Monocytes Absolute: 1.2 10*3/uL — ABNORMAL HIGH (ref 0.1–1.0)
Monocytes Relative: 11 %
Neutro Abs: 6.4 10*3/uL (ref 1.7–7.7)
Neutrophils Relative %: 55 %
Platelets: 273 10*3/uL (ref 150–400)
RBC: 5.09 MIL/uL (ref 4.22–5.81)
RDW: 12.7 % (ref 11.5–15.5)
WBC: 11.5 10*3/uL — ABNORMAL HIGH (ref 4.0–10.5)
nRBC: 0 % (ref 0.0–0.2)

## 2021-07-13 LAB — ACETAMINOPHEN LEVEL: Acetaminophen (Tylenol), Serum: <10 ug/mL — ABNORMAL LOW (ref 10–30)

## 2021-07-13 LAB — CBG MONITORING, ED: Glucose-Capillary: 121 mg/dL — ABNORMAL HIGH (ref 70–99)

## 2021-07-13 LAB — SALICYLATE LEVEL: Salicylate Lvl: <7 mg/dL — ABNORMAL LOW (ref 7.0–30.0)

## 2021-07-13 LAB — TROPONIN I (HIGH SENSITIVITY): Troponin I (High Sensitivity): 6 ng/L (ref <18)

## 2021-07-13 LAB — ETHANOL: Alcohol, Ethyl (B): <10 mg/dL (ref <10)

## 2021-07-13 LAB — MAGNESIUM: Magnesium: 2.2 mg/dL (ref 1.7–2.4)

## 2021-07-13 MED ORDER — EPINEPHRINE 0.3 MG/0.3ML IJ SOAJ: 0.3000 mg | Freq: Once | INTRAMUSCULAR | Status: DC

## 2021-07-13 MED ORDER — ETOMIDATE 2 MG/ML IV SOLN
20.0000 mg | Freq: Once | INTRAVENOUS | Status: AC
Start: 2021-07-13 — End: 2021-07-13
  Administered 2021-07-13: 20 mg via INTRAVENOUS

## 2021-07-13 MED ORDER — LORAZEPAM 2 MG/ML IJ SOLN
INTRAMUSCULAR | Status: AC
  Administered 2021-07-13: 2 mg via INTRAVENOUS
  Filled 2021-07-13: qty 1

## 2021-07-13 MED ORDER — MIDAZOLAM BOLUS VIA INFUSION: 0.0000 mg | INTRAVENOUS | Status: DC | PRN

## 2021-07-13 MED ORDER — SODIUM CHLORIDE 0.9 % IV BOLUS
1000.0000 mL | Freq: Once | INTRAVENOUS | Status: AC
  Administered 2021-07-13: 1000 mL via INTRAVENOUS

## 2021-07-13 MED ORDER — NOREPINEPHRINE 4 MG/250ML-% IV SOLN
0.0000 ug/min | INTRAVENOUS | Status: DC
  Administered 2021-07-13: 2 ug/min via INTRAVENOUS

## 2021-07-13 MED ORDER — SODIUM BICARBONATE 8.4 % IV SOLN
50.0000 meq | Freq: Once | INTRAVENOUS | Status: AC
  Administered 2021-07-13: 50 meq via INTRAVENOUS

## 2021-07-13 MED ORDER — MIDAZOLAM HCL 2 MG/2ML IJ SOLN
INTRAMUSCULAR | Status: AC
  Filled 2021-07-13: qty 4

## 2021-07-13 MED ORDER — MIDAZOLAM HCL 2 MG/2ML IJ SOLN
4.0000 mg | Freq: Once | INTRAMUSCULAR | Status: DC
  Filled 2021-07-13: qty 4

## 2021-07-13 MED ORDER — IOHEXOL 350 MG/ML SOLN
100.0000 mL | Freq: Once | INTRAVENOUS | Status: AC | PRN
  Administered 2021-07-13: 100 mL via INTRAVENOUS

## 2021-07-13 MED ORDER — MIDAZOLAM-SODIUM CHLORIDE 100-0.9 MG/100ML-% IV SOLN
INTRAVENOUS | Status: AC
  Filled 2021-07-13: qty 100

## 2021-07-13 MED ORDER — ETOMIDATE 2 MG/ML IV SOLN
INTRAVENOUS | Status: AC
  Filled 2021-07-13: qty 10

## 2021-07-13 MED ORDER — SUCCINYLCHOLINE CHLORIDE 200 MG/10ML IV SOSY: 150.0000 mg | PREFILLED_SYRINGE | Freq: Once | INTRAVENOUS | Status: AC

## 2021-07-13 MED ORDER — CHARCOAL ACTIVATED PO LIQD
50.0000 g | Freq: Once | ORAL | Status: AC
Start: 2021-07-13 — End: 2021-07-13
  Administered 2021-07-13: 50 g via ORAL
  Filled 2021-07-13: qty 240

## 2021-07-13 MED ORDER — CALCIUM CHLORIDE 10 % IV SOLN
1.0000 g | Freq: Once | INTRAVENOUS | Status: AC
  Administered 2021-07-13: 1 g via INTRAVENOUS

## 2021-07-13 MED ORDER — EPINEPHRINE PF 1 MG/ML IJ SOLN
1.0000 mg | Freq: Once | INTRAMUSCULAR | Status: AC
  Administered 2021-07-13: 1 mg via INTRAVENOUS

## 2021-07-13 MED ORDER — FENTANYL CITRATE PF 50 MCG/ML IJ SOSY: 50.0000 ug | PREFILLED_SYRINGE | INTRAMUSCULAR | Status: DC | PRN

## 2021-07-13 MED ORDER — SUCCINYLCHOLINE CHLORIDE 200 MG/10ML IV SOSY
PREFILLED_SYRINGE | INTRAVENOUS | Status: AC
  Administered 2021-07-13: 150 mg via INTRAVENOUS
  Filled 2021-07-13: qty 10

## 2021-07-13 MED ORDER — LORAZEPAM 2 MG/ML IJ SOLN: 2.0000 mg | Freq: Once | INTRAMUSCULAR | Status: AC

## 2021-07-13 MED ORDER — LORAZEPAM 2 MG/ML IJ SOLN
2.0000 mg | Freq: Once | INTRAMUSCULAR | Status: AC
  Administered 2021-07-13: 2 mg via INTRAVENOUS
  Filled 2021-07-13: qty 1

## 2021-07-13 MED ORDER — FENTANYL CITRATE PF 50 MCG/ML IJ SOSY
50.0000 ug | PREFILLED_SYRINGE | INTRAMUSCULAR | Status: DC | PRN
  Filled 2021-07-13: qty 2

## 2021-07-13 MED ORDER — MIDAZOLAM-SODIUM CHLORIDE 100-0.9 MG/100ML-% IV SOLN
0.0000 mg/h | INTRAVENOUS | Status: DC
  Administered 2021-07-13: 4 mg/h via INTRAVENOUS

## 2021-07-13 MED ORDER — MIDAZOLAM HCL 2 MG/2ML IJ SOLN
4.0000 mg | Freq: Once | INTRAMUSCULAR | Status: AC
Start: 2021-07-13 — End: 2021-07-13
  Administered 2021-07-13: 4 mg via INTRAVENOUS

## 2021-07-13 NOTE — ED Notes (Signed)
Portable xray completed

## 2021-07-13 NOTE — Progress Notes (Signed)
PT intubated by ED MD, with 7.5 ETT at 25/lip, ETT on on right side. Placed on Trilogy Vent on AC/VC mode, VT 500, Rate 20, PEEP +8 and 100% FiO2. PT has injury on bottom left side of his inner lip, was noticed prior to intubation. Possibly due to his episode of seizure. ETT placement confirmed with bilateral breath sounds and chest rise, as well as chest x-ray and end tidal CO2 detector. Ambu-bag at head of bed with mask and 10cc syringe.

## 2021-07-13 NOTE — ED Provider Notes (Signed)
[facility] Provider Note    Event Date/Time   First MD Initiated Contact with Patient 07/13/21 2301     (approximate)   History   Drug Overdose   HPI  Andrew Parsons is a 41 y.o. male  here with cocaine overdose. Pt reportedly swallowed "2-3 grams" of cocaine about 30 minutes prior to arrival. He arrived at a friend's door saying he needed help. Since then, he's become very anxious, and is just repeating "help me." He is unable to provide history. Friend does not know details of what he took and why.  Level 5 caveat invoked as remainder of history, ROS, and physical exam limited due to patient's intoxication/mental status.        Physical Exam   Triage Vital Signs: ED Triage Vitals  Enc Vitals Group     BP 07/13/21 2214 (!) 126/106     Pulse Rate 07/13/21 2214 (!) 118     Resp 07/13/21 2214 (!) 60     Temp 07/13/21 2250 99.3 F (37.4 C)     Temp src --      SpO2 07/13/21 2214 100 %     Weight 07/13/21 2213 290 lb (131.5 kg)     Height 07/13/21 2213 5\' 11"  (1.803 m)     Head Circumference --      Peak Flow --      Pain Score --      Pain Loc --      Pain Edu? --      Excl. in GC? --     Most recent vital signs: Vitals:   07/14/21 0030 07/14/21 0031  BP: 116/62 118/70  Pulse: 88 88  Resp: (!) 23 (!) 23  Temp: 100 F (37.8 C) 99.9 F (37.7 C)  SpO2: 97% 97%     General: Awake, distressed, hyperventilating, moaning. CV:  Tachycardic, no appreciable murmurs. Resp:  Tachypneic, but lungs clear bilaterally Abd:  No distention. No overt tenderness. Other:  Anxious, diaphoretic. Pupils dilated but reactive bilaterally. Skin moist.    ED Results / Procedures / Treatments   Labs (all labs ordered are listed, but only abnormal results are displayed) Labs Reviewed  CBC WITH DIFFERENTIAL/PLATELET - Abnormal; Notable for the following components:      Result Value   WBC 11.5 (*)    Monocytes Absolute 1.2 (*)    All other components  within normal limits  COMPREHENSIVE METABOLIC PANEL - Abnormal; Notable for the following components:   Potassium 3.2 (*)    CO2 20 (*)    Glucose, Bld 120 (*)    BUN 24 (*)    Creatinine, Ser 1.29 (*)    Total Protein 8.4 (*)    All other components within normal limits  ACETAMINOPHEN LEVEL - Abnormal; Notable for the following components:   Acetaminophen (Tylenol), Serum <10 (*)    All other components within normal limits  SALICYLATE LEVEL - Abnormal; Notable for the following components:   Salicylate Lvl <7.0 (*)    All other components within normal limits  AMMONIA - Abnormal; Notable for the following components:   Ammonia 40 (*)    All other components within normal limits  CBG MONITORING, ED - Abnormal; Notable for the following components:   Glucose-Capillary 121 (*)    All other components within normal limits  TROPONIN I (HIGH SENSITIVITY) - Abnormal; Notable for the following components:   Troponin I (High Sensitivity) 40 (*)    All other components within  normal limits  ETHANOL  MAGNESIUM  TROPONIN I (HIGH SENSITIVITY)     EKG Sinus tachycardia, VR 119. PR 127, QRS 99, QTc 503. Prolonged QT. Non-specific ST changes.  EKG 2: Sinus tachycardia, VR 127. PR 124, QRS 127, QTc 453. QTc has improved, nonspecific IVCD.    RADIOLOGY CXR: Intubated, gastric catheter needs advanced CT Head/C/A/P: Pending   I also independently reviewed and agree with radiologist interpretations.   PROCEDURES:  Critical Care performed: Yes, see critical care procedure note(s)  .Critical Care  Performed by: Shaune Pollack, MD Authorized by: Shaune Pollack, MD   Critical care provider statement:    Critical care time (minutes):  70   Critical care time was exclusive of:  Separately billable procedures and treating other patients   Critical care was necessary to treat or prevent imminent or life-threatening deterioration of the following conditions:  Respiratory failure, cardiac  failure, circulatory failure, CNS failure or compromise and shock   Critical care was time spent personally by me on the following activities:  Development of treatment plan with patient or surrogate, discussions with consultants, evaluation of patient's response to treatment, examination of patient, ordering and review of laboratory studies, ordering and review of radiographic studies, ordering and performing treatments and interventions, pulse oximetry, re-evaluation of patient's condition and review of old charts   I assumed direction of critical care for this patient from another provider in my specialty: no     Care discussed with: admitting provider   Procedure Name: Intubation Date/Time: 07/13/2021 11:38 PM  Performed by: Shaune Pollack, MDPre-anesthesia Checklist: Patient identified, Patient being monitored, Emergency Drugs available, Timeout performed and Suction available Oxygen Delivery Method: Non-rebreather mask Preoxygenation: Pre-oxygenation with 100% oxygen Induction Type: IV induction Ventilation: Mask ventilation without difficulty Laryngoscope Size: Glidescope and 4 Grade View: Grade I Tube size: 7.5 mm Number of attempts: 1 Airway Equipment and Method: Rigid stylet Placement Confirmation: ETT inserted through vocal cords under direct vision Secured at: 24 cm Dental Injury: Teeth and Oropharynx as per pre-operative assessment  Difficulty Due To: Difficulty was unanticipated Future Recommendations: Recommend- induction with short-acting agent, and alternative techniques readily available Comments: Patient had moderate bloody secretions in airway from biting tongue.         MEDICATIONS ORDERED IN ED: Medications  LORazepam (ATIVAN) injection 2 mg (2 mg Intravenous Given 07/13/21 2228)  LORazepam (ATIVAN) injection 2 mg (2 mg Intravenous Given 07/13/21 2230)  succinylcholine (ANECTINE) syringe 150 mg (150 mg Intravenous Given 07/13/21 2235)  etomidate (AMIDATE) injection  20 mg (20 mg Intravenous Given 07/13/21 2235)  sodium bicarbonate injection 50 mEq (50 mEq Intravenous Given 07/13/21 2237)  calcium chloride injection 1 g (1 g Intravenous Given 07/13/21 2237)  sodium bicarbonate injection 50 mEq (50 mEq Intravenous Given 07/13/21 2238)  midazolam (VERSED) injection 4 mg (4 mg Intravenous Given 07/13/21 2242)  EPINEPHrine (ADRENALIN) 1 mg (1 mg Intravenous Given 07/13/21 2237)  charcoal activated (NO SORBITOL) (ACTIDOSE-AQUA) suspension 50 g (50 g Oral Given 07/13/21 2306)  sodium chloride 0.9 % bolus 1,000 mL (0 mLs Intravenous Stopped 07/13/21 2313)  sodium chloride 0.9 % bolus 1,000 mL (0 mLs Intravenous Stopped 07/14/21 0239)  sodium chloride 0.9 % bolus 1,000 mL (0 mLs Intravenous Stopped 07/14/21 0238)  iohexol (OMNIPAQUE) 350 MG/ML injection 100 mL (100 mLs Intravenous Contrast Given 07/13/21 2353)     IMPRESSION / MDM / ASSESSMENT AND PLAN / ED COURSE  I reviewed the triage vital signs and the nursing notes.  The patient is on the cardiac monitor to evaluate for evidence of arrhythmia and/or significant heart rate changes.   Ddx:  Differential includes the following, with pertinent life- or limb-threatening emergencies considered:  Cocaine overdose, polysubstance overdose, anticholinergic toxidrome, psychosis  Patient's presentation is most consistent with acute presentation with potential threat to life or bodily function.  MDM:  41 yo M here with acute cocaine overdose. Reportedly swallowed 3g prior to arrival. Pt initially anxious but responding intermittently, then entered generalized TC seizures shortly after being roomed. Pt given IM and IV ativan, then intubated for airway protection. Pt initially tacycardic, hypertensive, but during seizures had brief episode of bradycardia. This returned after being given epi, intubated, and fluids. Shortly after, he remained hypotensive but HR improved, and no additional seizure like activity  noted. Pt also given IV Bicarb x 2 for prolonged QT and bradycardia during the episode, as well as Calcium for cardiac membrane stabilization. QTc improved on repeat EKG. No STEMI noted.   Suspect acute, severe cocaine toxicity with cardiac and neurologic effects. Discussed with ICU and will transfer to ICU at [facility] for admission and cEEG given his seizure activity. Versed gtt ordered. IVF ordered and levo given for hypotension, which could be related to his sedation and intubation. Labs show leukocytosis as expected, mild AKI though I suspect this may worsen with his seizures, neg tox labs. CXR shows appropriate Et placement, OG advanced.  Pt given activated charcoal via OG, versed gtt, and bicarb with fluids as above. Given his hypotension in setting of initial hypertension, cocaine toxicity, will check CT angio for dissection. Will admit to [facility] ICU. Attempted to contact father without success.  Was able to contact father, discussed pt's critical illness and plan to transfer to [facility].   MEDICATIONS GIVEN IN ED: Medications  LORazepam (ATIVAN) injection 2 mg (2 mg Intravenous Given 07/13/21 2228)  LORazepam (ATIVAN) injection 2 mg (2 mg Intravenous Given 07/13/21 2230)  succinylcholine (ANECTINE) syringe 150 mg (150 mg Intravenous Given 07/13/21 2235)  etomidate (AMIDATE) injection 20 mg (20 mg Intravenous Given 07/13/21 2235)  sodium bicarbonate injection 50 mEq (50 mEq Intravenous Given 07/13/21 2237)  calcium chloride injection 1 g (1 g Intravenous Given 07/13/21 2237)  sodium bicarbonate injection 50 mEq (50 mEq Intravenous Given 07/13/21 2238)  midazolam (VERSED) injection 4 mg (4 mg Intravenous Given 07/13/21 2242)  EPINEPHrine (ADRENALIN) 1 mg (1 mg Intravenous Given 07/13/21 2237)  charcoal activated (NO SORBITOL) (ACTIDOSE-AQUA) suspension 50 g (50 g Oral Given 07/13/21 2306)  sodium chloride 0.9 % bolus 1,000 mL (0 mLs Intravenous Stopped 07/13/21 2313)  sodium chloride 0.9 % bolus 1,000 mL (0 mLs  Intravenous Stopped 07/14/21 0239)  sodium chloride 0.9 % bolus 1,000 mL (0 mLs Intravenous Stopped 07/14/21 0238)  iohexol (OMNIPAQUE) 350 MG/ML injection 100 mL (100 mLs Intravenous Contrast Given 07/13/21 2353)     Consults:  Intensivist   EMR reviewed  Prior ED visits noted for depression, meth and susbtance abuse     FINAL CLINICAL IMPRESSION(S) / ED DIAGNOSES   Final diagnoses:  Overdose of cocaine, accidental or unintentional, initial encounter (HCC)  Acute respiratory failure with hypoxia (HCC)     Rx / DC Orders   ED Discharge Orders     None        Note:  This document was prepared using Dragon voice recognition software and may include unintentional dictation errors.   Shaune PollackIsaacs, Bricelyn Freestone, MD 07/14/21 1131

## 2021-07-13 NOTE — ED Triage Notes (Addendum)
pt brought in by friend to lobby; pt unable to stand unassisted; pt shaking,hyperventilating, A&Ox3,st swallowed "a bunch of cocaine and meth"; taken immed to room 10 via w/c; placed on monitor; chrge nurse and Dr Erma Heritage notified

## 2021-07-13 NOTE — ED Notes (Signed)
Seizure continues; Noted SL to left a/c out--cannula intact; drsng applied; MD aware, will repeat ativan

## 2021-07-13 NOTE — ED Notes (Signed)
OG tube advanced from 53 cm to 58 cm by Alvino Chapel, RN.

## 2021-07-13 NOTE — ED Notes (Signed)
Pt now with tonic clonic seizure; 100% O2 NRB placed; seizure pads on bed; suction at bedside; Dr Erma Heritage at bedside

## 2021-07-13 NOTE — ED Notes (Addendum)
RT at bedside; Dr Ellender Hose intubated, ETT 8, 25 at lip; placement verified with CO2detector and auscultation; tube secured

## 2021-07-13 NOTE — ED Notes (Addendum)
RT reports air leak noted with current ETT. Removed and new 7.5  ETT, 25 at the lip inserted by Dr Erma Heritage, RT at bedside

## 2021-07-14 ENCOUNTER — Inpatient Hospital Stay (HOSPITAL_COMMUNITY)
Admission: AD | Admit: 2021-07-14 | Discharge: 2021-07-15 | DRG: 917 | Disposition: A | Discharge status: Home / Self Care | Payer: Self-pay | Source: Other Acute Inpatient Hospital | Attending: Student | Admitting: Student

## 2021-07-14 ENCOUNTER — Inpatient Hospital Stay (HOSPITAL_COMMUNITY): Payer: Self-pay

## 2021-07-14 DIAGNOSIS — T405X1A Poisoning by cocaine, accidental (unintentional), initial encounter: Secondary | ICD-10-CM | POA: Diagnosis present

## 2021-07-14 DIAGNOSIS — Z79899 Other long term (current) drug therapy: Secondary | ICD-10-CM

## 2021-07-14 DIAGNOSIS — J690 Pneumonitis due to inhalation of food and vomit: Secondary | ICD-10-CM | POA: Diagnosis present

## 2021-07-14 DIAGNOSIS — R569 Unspecified convulsions: Principal | ICD-10-CM

## 2021-07-14 DIAGNOSIS — Z823 Family history of stroke: Secondary | ICD-10-CM

## 2021-07-14 DIAGNOSIS — Z781 Physical restraint status: Secondary | ICD-10-CM

## 2021-07-14 DIAGNOSIS — F32A Depression, unspecified: Secondary | ICD-10-CM | POA: Diagnosis present

## 2021-07-14 DIAGNOSIS — Z818 Family history of other mental and behavioral disorders: Secondary | ICD-10-CM

## 2021-07-14 DIAGNOSIS — I161 Hypertensive emergency: Secondary | ICD-10-CM | POA: Diagnosis present

## 2021-07-14 DIAGNOSIS — F17210 Nicotine dependence, cigarettes, uncomplicated: Secondary | ICD-10-CM | POA: Diagnosis present

## 2021-07-14 DIAGNOSIS — G40901 Epilepsy, unspecified, not intractable, with status epilepticus: Secondary | ICD-10-CM | POA: Diagnosis present

## 2021-07-14 DIAGNOSIS — F419 Anxiety disorder, unspecified: Secondary | ICD-10-CM | POA: Diagnosis present

## 2021-07-14 DIAGNOSIS — E785 Hyperlipidemia, unspecified: Secondary | ICD-10-CM | POA: Diagnosis present

## 2021-07-14 DIAGNOSIS — Z9911 Dependence on respirator [ventilator] status: Secondary | ICD-10-CM

## 2021-07-14 DIAGNOSIS — E876 Hypokalemia: Secondary | ICD-10-CM | POA: Diagnosis present

## 2021-07-14 DIAGNOSIS — E7220 Disorder of urea cycle metabolism, unspecified: Secondary | ICD-10-CM | POA: Diagnosis present

## 2021-07-14 DIAGNOSIS — I10 Essential (primary) hypertension: Secondary | ICD-10-CM | POA: Diagnosis present

## 2021-07-14 DIAGNOSIS — J9601 Acute respiratory failure with hypoxia: Secondary | ICD-10-CM | POA: Diagnosis present

## 2021-07-14 DIAGNOSIS — R739 Hyperglycemia, unspecified: Secondary | ICD-10-CM | POA: Diagnosis present

## 2021-07-14 DIAGNOSIS — T43651A Poisoning by methamphetamines accidental (unintentional), initial encounter: Principal | ICD-10-CM | POA: Diagnosis present

## 2021-07-14 DIAGNOSIS — F1990 Other psychoactive substance use, unspecified, uncomplicated: Secondary | ICD-10-CM

## 2021-07-14 LAB — CBC WITH DIFFERENTIAL/PLATELET
Abs Immature Granulocytes: 0.05 10*3/uL (ref 0.00–0.07)
Basophils Absolute: 0 10*3/uL (ref 0.0–0.1)
Basophils Relative: 0 %
Eosinophils Absolute: 0 10*3/uL (ref 0.0–0.5)
Eosinophils Relative: 0 %
HCT: 34.8 % — ABNORMAL LOW (ref 39.0–52.0)
Hemoglobin: 12.5 g/dL — ABNORMAL LOW (ref 13.0–17.0)
Immature Granulocytes: 1 %
Lymphocytes Relative: 11 %
Lymphs Abs: 0.9 10*3/uL (ref 0.7–4.0)
MCH: 30.9 pg (ref 26.0–34.0)
MCHC: 35.9 g/dL (ref 30.0–36.0)
MCV: 86.1 fL (ref 80.0–100.0)
Monocytes Absolute: 0.6 10*3/uL (ref 0.1–1.0)
Monocytes Relative: 8 %
Neutro Abs: 6.4 10*3/uL (ref 1.7–7.7)
Neutrophils Relative %: 80 %
Platelets: 146 10*3/uL — ABNORMAL LOW (ref 150–400)
RBC: 4.04 MIL/uL — ABNORMAL LOW (ref 4.22–5.81)
RDW: 12.7 % (ref 11.5–15.5)
WBC: 8.1 10*3/uL (ref 4.0–10.5)
nRBC: 0 % (ref 0.0–0.2)

## 2021-07-14 LAB — BASIC METABOLIC PANEL
Anion gap: 6 (ref 5–15)
BUN: 13 mg/dL (ref 6–20)
CO2: 23 mmol/L (ref 22–32)
Calcium: 8.3 mg/dL — ABNORMAL LOW (ref 8.9–10.3)
Chloride: 112 mmol/L — ABNORMAL HIGH (ref 98–111)
Creatinine, Ser: 1.29 mg/dL — ABNORMAL HIGH (ref 0.61–1.24)
GFR, Estimated: >60 mL/min (ref >60)
Glucose, Bld: 88 mg/dL (ref 70–99)
Potassium: 3.6 mmol/L (ref 3.5–5.1)
Sodium: 141 mmol/L (ref 135–145)

## 2021-07-14 LAB — AMMONIA: Ammonia: 40 umol/L — ABNORMAL HIGH (ref 9–35)

## 2021-07-14 LAB — URINALYSIS, MICROSCOPIC (REFLEX)

## 2021-07-14 LAB — COMPREHENSIVE METABOLIC PANEL
ALT: 40 U/L (ref 0–44)
AST: 37 U/L (ref 15–41)
Albumin: 3.2 g/dL — ABNORMAL LOW (ref 3.5–5.0)
Alkaline Phosphatase: 56 U/L (ref 38–126)
Anion gap: 5 (ref 5–15)
BUN: 16 mg/dL (ref 6–20)
CO2: 21 mmol/L — ABNORMAL LOW (ref 22–32)
Calcium: 8.3 mg/dL — ABNORMAL LOW (ref 8.9–10.3)
Chloride: 111 mmol/L (ref 98–111)
Creatinine, Ser: 1.35 mg/dL — ABNORMAL HIGH (ref 0.61–1.24)
GFR, Estimated: >60 mL/min (ref >60)
Glucose, Bld: 114 mg/dL — ABNORMAL HIGH (ref 70–99)
Potassium: 3 mmol/L — ABNORMAL LOW (ref 3.5–5.1)
Sodium: 137 mmol/L (ref 135–145)
Total Bilirubin: 1.4 mg/dL — ABNORMAL HIGH (ref 0.3–1.2)
Total Protein: 5.7 g/dL — ABNORMAL LOW (ref 6.5–8.1)

## 2021-07-14 LAB — URINALYSIS, ROUTINE W REFLEX MICROSCOPIC
Bilirubin Urine: NEGATIVE
Glucose, UA: NEGATIVE mg/dL
Ketones, ur: NEGATIVE mg/dL
Leukocytes,Ua: NEGATIVE
Nitrite: NEGATIVE
Protein, ur: NEGATIVE mg/dL
Specific Gravity, Urine: <1.005 — ABNORMAL LOW (ref 1.005–1.030)
pH: 6 (ref 5.0–8.0)

## 2021-07-14 LAB — POCT I-STAT 7, (LYTES, BLD GAS, ICA,H+H)
Acid-base deficit: 1 mmol/L (ref 0.0–2.0)
Bicarbonate: 22.5 mmol/L (ref 20.0–28.0)
Calcium, Ion: 1.22 mmol/L (ref 1.15–1.40)
HCT: 36 % — ABNORMAL LOW (ref 39.0–52.0)
Hemoglobin: 12.2 g/dL — ABNORMAL LOW (ref 13.0–17.0)
O2 Saturation: 99 %
Patient temperature: 96.8
Potassium: 3.5 mmol/L (ref 3.5–5.1)
Sodium: 140 mmol/L (ref 135–145)
TCO2: 23 mmol/L (ref 22–32)
pCO2 arterial: 31.1 mmHg — ABNORMAL LOW (ref 32–48)
pH, Arterial: 7.464 — ABNORMAL HIGH (ref 7.35–7.45)
pO2, Arterial: 136 mmHg — ABNORMAL HIGH (ref 83–108)

## 2021-07-14 LAB — RAPID URINE DRUG SCREEN, HOSP PERFORMED
Amphetamines: POSITIVE — AB
Barbiturates: NOT DETECTED
Benzodiazepines: POSITIVE — AB
Cocaine: POSITIVE — AB
Opiates: NOT DETECTED
Tetrahydrocannabinol: NOT DETECTED

## 2021-07-14 LAB — MAGNESIUM: Magnesium: 2.5 mg/dL — ABNORMAL HIGH (ref 1.7–2.4)

## 2021-07-14 LAB — MRSA NEXT GEN BY PCR, NASAL: MRSA by PCR Next Gen: NOT DETECTED

## 2021-07-14 LAB — PROTIME-INR
INR: 1.2 (ref 0.8–1.2)
Prothrombin Time: 15 seconds (ref 11.4–15.2)

## 2021-07-14 LAB — PROCALCITONIN: Procalcitonin: 0.44 ng/mL

## 2021-07-14 LAB — HIV ANTIBODY (ROUTINE TESTING W REFLEX): HIV Screen 4th Generation wRfx: NONREACTIVE

## 2021-07-14 LAB — TROPONIN I (HIGH SENSITIVITY): Troponin I (High Sensitivity): 40 ng/L — ABNORMAL HIGH (ref <18)

## 2021-07-14 LAB — GLUCOSE, CAPILLARY
Glucose-Capillary: 123 mg/dL — ABNORMAL HIGH (ref 70–99)
Glucose-Capillary: 76 mg/dL (ref 70–99)
Glucose-Capillary: 104 mg/dL — ABNORMAL HIGH (ref 70–99)
Glucose-Capillary: 79 mg/dL (ref 70–99)

## 2021-07-14 LAB — CK: Total CK: 636 U/L — ABNORMAL HIGH (ref 49–397)

## 2021-07-14 LAB — PHOSPHORUS: Phosphorus: 3.3 mg/dL (ref 2.5–4.6)

## 2021-07-14 MED ORDER — CHLORHEXIDINE GLUCONATE CLOTH 2 % EX PADS: 6.0000 | MEDICATED_PAD | Freq: Every day | CUTANEOUS | Status: DC

## 2021-07-14 MED ORDER — PROSOURCE TF PO LIQD
45.0000 mL | Freq: Two times a day (BID) | ORAL | Status: DC
  Filled 2021-07-14: qty 45

## 2021-07-14 MED ORDER — CHLORHEXIDINE GLUCONATE 0.12% ORAL RINSE (MEDLINE KIT)
15.0000 mL | Freq: Two times a day (BID) | OROMUCOSAL | Status: DC
  Administered 2021-07-14: 15 mL via OROMUCOSAL

## 2021-07-14 MED ORDER — PROPOFOL 1000 MG/100ML IV EMUL
INTRAVENOUS | Status: AC
  Filled 2021-07-14: qty 100

## 2021-07-14 MED ORDER — DEXMEDETOMIDINE HCL IN NACL 400 MCG/100ML IV SOLN
0.4000 ug/kg/h | INTRAVENOUS | Status: DC
  Administered 2021-07-14: 0.4 ug/kg/h via INTRAVENOUS
  Filled 2021-07-14: qty 100

## 2021-07-14 MED ORDER — FENTANYL CITRATE PF 50 MCG/ML IJ SOSY: 50.0000 ug | PREFILLED_SYRINGE | INTRAMUSCULAR | Status: DC | PRN

## 2021-07-14 MED ORDER — LORAZEPAM 2 MG/ML IJ SOLN: 1.0000 mg | INTRAMUSCULAR | Status: DC | PRN

## 2021-07-14 MED ORDER — MIDAZOLAM-SODIUM CHLORIDE 100-0.9 MG/100ML-% IV SOLN
0.0000 mg/h | INTRAVENOUS | Status: DC
  Administered 2021-07-14: 8 mg/h via INTRAVENOUS

## 2021-07-14 MED ORDER — LACTATED RINGERS IV BOLUS
1000.0000 mL | Freq: Once | INTRAVENOUS | Status: AC
Start: 2021-07-14 — End: 2021-07-14
  Administered 2021-07-14: 1000 mL via INTRAVENOUS

## 2021-07-14 MED ORDER — MIDAZOLAM BOLUS VIA INFUSION
0.0000 mg | INTRAVENOUS | Status: DC | PRN
  Administered 2021-07-14 (×2): 4 mg via INTRAVENOUS

## 2021-07-14 MED ORDER — ADULT MULTIVITAMIN W/MINERALS CH
1.0000 | ORAL_TABLET | Freq: Every day | ORAL | Status: DC
  Administered 2021-07-15: 1
  Filled 2021-07-14 (×2): qty 1

## 2021-07-14 MED ORDER — INSULIN ASPART 100 UNIT/ML IJ SOLN
0.0000 [IU] | INTRAMUSCULAR | Status: DC
  Administered 2021-07-14: 2 [IU] via SUBCUTANEOUS

## 2021-07-14 MED ORDER — POTASSIUM CHLORIDE 10 MEQ/100ML IV SOLN
10.0000 meq | INTRAVENOUS | Status: AC
  Administered 2021-07-14 (×4): 10 meq via INTRAVENOUS
  Filled 2021-07-14 (×4): qty 100

## 2021-07-14 MED ORDER — PANTOPRAZOLE 2 MG/ML SUSPENSION
40.0000 mg | Freq: Every day | ORAL | Status: DC
  Filled 2021-07-14: qty 20

## 2021-07-14 MED ORDER — VITAL HIGH PROTEIN PO LIQD: 1000.0000 mL | ORAL | Status: DC

## 2021-07-14 MED ORDER — LACTATED RINGERS IV SOLN
INTRAVENOUS | Status: DC
Start: 2021-07-14 — End: 2021-07-14

## 2021-07-14 MED ORDER — THIAMINE HCL 100 MG/ML IJ SOLN
100.0000 mg | Freq: Every day | INTRAMUSCULAR | Status: DC
Start: 2021-07-14 — End: 2021-07-15
  Administered 2021-07-14 – 2021-07-15 (×2): 100 mg via INTRAVENOUS
  Filled 2021-07-14 (×2): qty 2

## 2021-07-14 MED ORDER — ENOXAPARIN SODIUM 40 MG/0.4ML IJ SOSY
40.0000 mg | PREFILLED_SYRINGE | INTRAMUSCULAR | Status: DC
  Administered 2021-07-14: 40 mg via SUBCUTANEOUS
  Filled 2021-07-14 (×2): qty 0.4

## 2021-07-14 MED ORDER — FOLIC ACID 1 MG PO TABS
1.0000 mg | ORAL_TABLET | Freq: Every day | ORAL | Status: DC
  Administered 2021-07-15: 1 mg
  Filled 2021-07-14 (×2): qty 1

## 2021-07-14 MED ORDER — LACTULOSE 10 GM/15ML PO SOLN
30.0000 g | Freq: Three times a day (TID) | ORAL | Status: DC
  Filled 2021-07-14: qty 45

## 2021-07-14 MED ORDER — MIDAZOLAM HCL 2 MG/2ML IJ SOLN: 2.0000 mg | INTRAMUSCULAR | Status: DC | PRN

## 2021-07-14 MED ORDER — ORAL CARE MOUTH RINSE
15.0000 mL | OROMUCOSAL | Status: DC
  Administered 2021-07-14: 15 mL via OROMUCOSAL

## 2021-07-14 MED ORDER — SODIUM CHLORIDE 0.9 % IV SOLN
3.0000 g | Freq: Four times a day (QID) | INTRAVENOUS | Status: DC
  Administered 2021-07-14 – 2021-07-15 (×6): 3 g via INTRAVENOUS
  Filled 2021-07-14 (×6): qty 8

## 2021-07-14 MED ORDER — POLYETHYLENE GLYCOL 3350 17 G PO PACK: 17.0000 g | PACK | Freq: Every day | ORAL | Status: DC | PRN

## 2021-07-14 MED ORDER — AMLODIPINE BESYLATE 5 MG PO TABS
5.0000 mg | ORAL_TABLET | Freq: Every day | ORAL | Status: DC
  Administered 2021-07-15: 5 mg
  Filled 2021-07-14 (×2): qty 1

## 2021-07-14 MED ORDER — DOCUSATE SODIUM 50 MG/5ML PO LIQD: 100.0000 mg | Freq: Two times a day (BID) | ORAL | Status: DC | PRN

## 2021-07-14 MED ORDER — PROPOFOL 1000 MG/100ML IV EMUL
0.0000 ug/kg/min | INTRAVENOUS | Status: DC
  Administered 2021-07-14: 25 ug/kg/min via INTRAVENOUS

## 2021-07-14 NOTE — Progress Notes (Signed)
Spoke with patient and asking orientation questions.  He knew he was in the hospital but was incorrect as to the reason ("I was choking"). I told him he had a seizure and it was likely due to him ingesting drugs. He did recall doing that. When asked if he was trying to hurt himself he said yes then stated "I don't want to go back to that place". I asked if he meant [facility] and he said yes. Patient seems very pleasant and wanting to get better to go home. Will continue to monitor with 1:1 sitter and assess his mental status. Sirinity Outland C 5:20 PM

## 2021-07-14 NOTE — Progress Notes (Signed)
LTM EEG discontinued - no skin breakdown at unhook.   

## 2021-07-14 NOTE — Progress Notes (Signed)
eLink Physician-Brief Progress Note Patient Name: Andrew Parsons DOB: [birth year] MRN: [id number]   Date of Service  07/14/2021  HPI/Events of Note  Agitation - Nursing request for bilateral soft wrist restraints.   eICU Interventions  Will order soft bilateral wrist restraints X 3 hours.      Intervention Category Major Interventions: Delirium, psychosis, severe agitation - evaluation and management  Adarian Bur Eugene 07/14/2021, 6:52 AM

## 2021-07-14 NOTE — Procedures (Signed)
Extubation Procedure Note  Patient Details:   Name: Jojo Sifers DOB: 04-Nov-1980 MRN: [id number]   Airway Documentation:    Vent end date: 07/14/21 Vent end time: 0955   Evaluation  O2 sats: stable throughout Complications: No apparent complications Patient did tolerate procedure well. Bilateral Breath Sounds: Rhonchi, Diminished   Yes,  Per order, pt was [facility]. Prior to extubation, pt did have a positive cuff leak. Pt tolerated well with SVS and no complications. Pt was able to state his name. No stridor was noted. RT will continue to monitor pt.  Rossie Muskrat R 07/14/2021, 10:01 AM

## 2021-07-14 NOTE — Progress Notes (Signed)
[facility] Progress Note Patient Name: Andrew Parsons DOB: [birth year] MRN: [id number]   Date of Service  07/14/2021  HPI/Events of Note  Hypokalemia - K+ = 3.0. K+ still being repleated. Last dose scheduled for 7 AM.  eICU Interventions  Plan: Repeat BMP at 10 AM.     Intervention Category Major Interventions: Electrolyte abnormality - evaluation and management  Kiyaan Haq Eugene 07/14/2021, 5:20 AM

## 2021-07-14 NOTE — ED Notes (Signed)
[employer] CALLED DUE TO OVERDOSE REPORT ON PT.

## 2021-07-14 NOTE — Progress Notes (Signed)
LTM EEG hooked up and running - no initial skin breakdown - push button tested - neuro notified. [facility] monitoring.  

## 2021-07-14 NOTE — Progress Notes (Signed)
Follow-up with patient now that he is talking more and eaten dinner - hours after the initial conversation of prior note. He still does not recall events of last night and, when I asked him if he wanted to hurt himself he said, "Do I look like someone that would hurt himself?" And then said "I'm sorry. No, I was not trying to hurt myself." He stated he is ready to go home tonight. Cord Wilczynski C  5:40 PM

## 2021-07-14 NOTE — Progress Notes (Signed)
No sz on EEG. With clearly provoked seizure, no need for MRI. If he were to have focal findings after extubation, could re-assess at that time.   Ritta Slot, MD [facility] [phone]  If 7pm- 7am, please page neurology on call as listed in AMION.

## 2021-07-14 NOTE — Procedures (Addendum)
Patient Name: Andrew Parsons  MRN: [id number]  Epilepsy Attending: Charlsie Quest  Referring Physician/Provider: Duayne Cal, NP  Duration: 07/14/2021 5027 to 1615   Patient history: 60 yoM admitted overnight with suspected cocaine and methamphetamine OD with seizures, r/o SE. EEG to evaluate for seizure   Level of alertness:  comatose   AEDs during EEG study: Propofol, Versed   Technical aspects: This EEG study was done with scalp electrodes positioned according to the 10-20 International system of electrode placement. Electrical activity was acquired at a sampling rate of 500Hz  and reviewed with a high frequency filter of 70Hz  and a low frequency filter of 1Hz . EEG data were recorded continuously and digitally stored.    Description: EEG initially showed intermittent generalized 5 to 6 Hz theta slowing admixed with generalized 12 to 13 Hz beta activity. As sedation was weaned, EEG improved and showed posterior dominant rhythm of 9-10 Hz activity of moderate voltage (25-35 uV) seen predominantly in posterior head regions, symmetric and reactive to eye opening and eye closing. Sleep was characterized by vertex waves, sleep spindles (12 to 14 Hz), maximal frontocentral region.  Hyperventilation and photic stimulation were not performed.      ABNORMALITY - Intermittent slow, generalized   IMPRESSION: This study was initially suggestive of moderate diffuse encephalopathy, nonspecific etiology but likely related to sedation. As sedation was improved, EEG improved and was within normal limits. No seizures or epileptiform discharges were seen throughout the recording.   Xareni Kelch 

## 2021-07-14 NOTE — ED Notes (Signed)
Father, Emmett Schurtz updated on [place] arrival to ED to transfer pt to [facility] is waiting at [facility] for pts arrival.    No consent signature able to obtain for transfer

## 2021-07-14 NOTE — Progress Notes (Signed)
EEG complete - results pending 

## 2021-07-14 NOTE — Procedures (Signed)
Patient Name: Andrew Parsons Class  MRN: [id number]  Epilepsy Attending: Charlsie Quest  Referring Physician/Provider: Duayne Cal, NP  Date: 07/14/2021 Duration: 24.17 mins  Patient history: 24 yoM admitted overnight with suspected cocaine and methamphetamine OD with seizures, r/o SE. EEG to evaluate for seizure  Level of alertness:  comatose  AEDs during EEG study: Propofol, Versed  Technical aspects: This EEG study was done with scalp electrodes positioned according to the 10-20 International system of electrode placement. Electrical activity was acquired at a sampling rate of 500Hz  and reviewed with a high frequency filter of 70Hz  and a low frequency filter of 1Hz . EEG data were recorded continuously and digitally stored.   Description: EEG showed continuous generalized 2 to 3 Hz delta slowing admixed with an excessive amount of 15 to 18 Hz beta activity distributed symmetrically and diffusely. Hyperventilation and photic stimulation were not performed.     ABNORMALITY - Continuous slow, generalized - Excessive beta, generalized  IMPRESSION: This study is suggestive of severe diffuse encephalopathy, nonspecific etiology but likely related to sedation. No seizures or epileptiform discharges were seen throughout the recording.  Evangelyn Crouse 

## 2021-07-14 NOTE — Consult Note (Addendum)
Neurology Consultation Reason for Consult: Seizures in the setting of ingestion Requesting Physician: Noemi Chapel  CC: Seizures  History is obtained from: Chart review given patient's mental status  HPI: Andrew Parsons is a 41 y.o. male with a past medical history significant for polysubstance use (cocaine, methamphetamines, alcohol), depression, recent psychiatric admission (January 2023), hypertension, hyperlipidemia.  He had multiple witnessed tonic-clonic seizures at [facility] ED after being brought in by a friend for difficulty standing after taking a large amount of drugs.  He was intubated and required a large amount of sedation. Transferred to [facility] for EEG monitoring.   Per nursing, agitated en route, localizing to ETT, bolused with 8 of additional midazolam. Here transitioning from propofol and versed to trying to minimize versed and intermittently purposeful but not following commands.   ROS: Unable to obtain due to altered mental status.   Past Medical History:  Diagnosis Date   Alcohol abuse    Anxiety 08/26/2019   Atypical syncope 08/26/2019   Depression    Fainting spell    Fatigue 08/26/2019   Hyperlipidemia    Hypertension    Polysubstance (excluding opioids) dependence ([place]) 08/26/2019   Sleep disturbance 08/26/2019   Past Surgical History:  Procedure Laterality Date   TYMPANOPLASTY     Current Outpatient Medications  Medication Instructions   amLODipine (NORVASC) 5 mg, Oral, Daily   sertraline (ZOLOFT) 100 mg, Oral, Daily     Family History  Problem Relation Age of Onset   Early death Mother    Stroke Mother    Depression Daughter    Hyperlipidemia Daughter     Social History:  reports that he has been smoking cigarettes. He has been smoking an average of .5 packs per day. He has never used smokeless tobacco. He reports current alcohol use. He reports that he does not currently use drugs after having used the following drugs: Methamphetamines, Cocaine, and  Marijuana.   Exam: Current vital signs: Temp 98.9 F (37.2 C) (Axillary)   SpO2 98%  Vital signs in last 24 hours: Temp:  [98.9 F (37.2 C)-100.7 F (38.2 C)] 98.9 F (37.2 C) (06/08 0300) Pulse Rate:  [60-144] 88 (06/08 0031) Resp:  [21-60] 23 (06/08 0031) BP: (58-204)/(28-106) 118/70 (06/08 0031) SpO2:  [81 %-100 %] 98 % (06/08 0232) FiO2 (%):  [50 %-100 %] 50 % (06/08 0254) Weight:  [131.5 kg] 131.5 kg (06/07 2213)  Physical Exam  Constitutional: Appears well-developed and well-nourished.  Psych: Minimally interactive Eyes: Scleral edema is absent   HENT: ET tube in place MSK: no joint deformities.  Cardiovascular: Normal rate and regular rhythm.  Respiratory: Breathing comfortably on the ventilator GI: Soft.  No distension. There is no tenderness.  Skin: Warm dry and intact visible skin.  There is no significant edema   Neuro: Mental Status: Does not open eyes spontaneously, to voice or noxious stimulation (on sedation) Does not follow any commands Cranial Nerves: II: No blink to threat on sedation. Pupils are equal, round, and reactive to light 3 mm to 2 mm III,IV, VI/VIII: EOMI absent to VOR on sedation V/VII: Facial sensation is bilaterally absent to eyelash brush on sedation VIII: No response to voice X/XI: Absent cough/gag on sedation XII: Unable to assess tongue protrusion secondary to patient's mental status  Motor/Sensory: Tone is normal. Bulk is normal.  No movement to noxious stim in any of the extremities  Deep Tendon Reflexes: 2+ and symmetric biceps, patellae Plantars: Toes are mute bilaterally.  Cerebellar: Unable to assess  secondary to patient's mental status    I have reviewed labs in epic and the results pertinent to this consultation are:  Basic Metabolic Panel: Recent Labs  Lab 07/13/21 2228  NA 139  K 3.2*  CL 108  CO2 20*  GLUCOSE 120*  BUN 24*  CREATININE 1.29*  CALCIUM 9.3  MG 2.2   Baseline creatinine 0.96  CBC: Recent  Labs  Lab 07/13/21 2228  WBC 11.5*  NEUTROABS 6.4  HGB 15.1  HCT 44.0  MCV 86.4  PLT 273   Ammonia 40  Troponin 6 increasing to 40    Coagulation Studies: No results for input(s): "LABPROT", "INR" in the last 72 hours.    I have reviewed the images obtained:  Head CT personally reviewed, agree with radiology no evidence of acute intracranial process  CT angio chest abdomen pelvis for dissection with and without contrast: 1. No acute aortic syndrome. 2. Right lower lobe collapse. 3. No acute abnormality of the abdomen pelvis.  Impression: My exam is limited by sedation, but nursing report is reassuring that patient is unlikely to be in status. However, will follow-up EEG, with potential LTM if not rapidly clearing mental status or fluctuating mental status without other etiology identified. Provoked seizures in the setting of substance ingestion, will confirm with UDS.   Recommendations: - EEG (in process), cEEG while weaning sedation - Hold standing antiseizure medications for now and manage withdrawal with standard PRN ativan. Consider phenobarbital if refractory - thiamine, multivitamin, folic acid given ethanol use history - MRI brain w/ and w/o - Trend CK to peak (ordered); fluid orders per primary to treat possible rhabdo - UA, UDS (ordered) - Replete electrolytes as needed  - Goal euglycemia - Goal normotension at this time - Seizure precautions  Standard seizure precautions to be reviewed with patient when mental status improves: Per [place] statutes, patients with seizures are not allowed to drive until  they have been seizure-free for six months. Use caution when using heavy equipment or power tools. Avoid working on ladders or at heights. Take showers instead of baths. Ensure the water temperature is not too high on the home water heater. Do not go swimming alone. When caring for infants or small children, sit down when holding, feeding, or changing  them to minimize risk of injury to the child in the event you have a seizure.  To reduce risk of seizures, maintain good sleep hygiene avoid alcohol and illicit drug use, take all anti-seizure medications as prescribed.   Lesleigh Noe MD-PhD [facility] [phone] Available 7 PM to 7 AM, outside of these hours please call Neurologist on call as listed on Amion.    Total critical care time: 35 minutes   Critical care time was exclusive of separately billable procedures and treating other patients.   Critical care was necessary to treat or prevent imminent or life-threatening deterioration.   Critical care was time spent personally by me on the following activities: development of treatment plan with patient and/or surrogate as well as nursing, discussions with consultants/primary team, evaluation of patient's response to treatment, examination of patient, obtaining history from patient or surrogate, ordering and performing treatments and interventions, ordering and review of laboratory studies, ordering and review of radiographic studies, and re-evaluation of patient's condition as needed, as documented above.

## 2021-07-14 NOTE — H&P (Signed)
NAME:  Andrew Parsons, MRN:  [id number], DOB:  [birth year], LOS: 0 ADMISSION DATE:   07/14/2021, CONSULTATION DATE:  07/14/21 REFERRING MD:  Sherle Poe, CHIEF COMPLAINT:  SE   History of Present Illness:  Andrew Parsons is a 41 y/o gentleman with a history of depression and psychiatric admission in January 2023 who presented with weakness and inability to stand up . He was brought into the ED by a friend who reported he had taken a large amount of cocaine and methamphetamines.  In the ED he developed a tonic clonic seizure and required intubation.   Pertinent  Medical History  ETOH abuse HTN depression  Significant Hospital Events: Including procedures, antibiotic start and stop dates in addition to other pertinent events   Transferred to [facility] from [facility] ED for EEG  Interim History / Subjective:    Objective   Temperature 98.9 F (37.2 C), temperature source Axillary, SpO2 98 %.    Vent Mode: PRVC FiO2 (%):  [50 %-100 %] 50 % Set Rate:  [20 bmp] 20 bmp Vt Set:  [500 mL] 500 mL PEEP:  [5 cmH20-8 cmH20] 5 cmH20 Plateau Pressure:  [17 cmH20] 17 cmH20  No intake or output data in the 24 hours ending 07/14/21 0310 There were no vitals filed for this visit.  Examination: General: critically ill appearing man lying in bed in NAD, intubated and sedated HENT: [place]/AT, eyes anicteric Lungs: CTAB, synchronous with MV Cardiovascular: S1S2, RRR Abdomen: soft, NT Extremities: warm feet, no erythema or swelling associated. No cyanosis or clubbing. Neuro: RASS -5, no gaze deviation GU: foley in place  CT head> no acute bleeding or obvious abnormalities CTA C/A/P>no dissection, dense RLL lobar consolidation  BUN 24 Cr 1.29 K+ 3.2 Trop 6>40  Resolved Hospital Problem list     Assessment & Plan:  Overdose of cocaine and methamphetamine Seizure with status epilepticus -seizure precautions -needs EEG -con't propofol and midazolam infusions -supportive care  Acute respiratory failure with  hypoxia due to seizure RLL lobar aspiration pneumonia vs pneumonitis -LTVV -VAP prevention protocol -PAD protocol for sedation -daily SAT & SBT as appropriate -unasyn -trach aspirate culture  Hypertensive emergency- resolved with sedation -avoid hypotension; target SBP ~160 today. Will give 1 L bolus now. -hold PTA amlodipine  History of depression -when extubated need to determine if this was a suicide attempt; would need psychiatry evaluation -hold PTA zoloft for now  Prediabetes and hyperglycemia -SSI PRN -goal BG <180 -OP follow up needed  Hypokalemia -repleted  Hyperammonemia -lactulose -can clarify if he is on meds that explain this when able to get med history  Lip injury likely from seizure, present on admission -wound care  Best Practice (right click and "Reselect all SmartList Selections" daily)   Diet/type: NPO DVT prophylaxis: LMWH GI prophylaxis: PPI Lines: N/A Foley:  Yes, and it is still needed Code Status:  full code Last date of multidisciplinary goals of care discussion [ ]   Labs   CBC: Recent Labs  Lab 07/13/21 2228  WBC 11.5*  NEUTROABS 6.4  HGB 15.1  HCT 44.0  MCV 86.4  PLT 273    Basic Metabolic Panel: Recent Labs  Lab 07/13/21 2228  NA 139  K 3.2*  CL 108  CO2 20*  GLUCOSE 120*  BUN 24*  CREATININE 1.29*  CALCIUM 9.3  MG 2.2   GFR: Estimated Creatinine Clearance: 103.2 mL/min (A) (by C-G formula based on SCr of 1.29 mg/dL (H)). Recent Labs  Lab 07/13/21 2228  WBC 11.5*  Liver Function Tests: Recent Labs  Lab 07/13/21 2228  AST 34  ALT 33  ALKPHOS 71  BILITOT 1.2  PROT 8.4*  ALBUMIN 4.7   No results for input(s): "LIPASE", "AMYLASE" in the last 168 hours. Recent Labs  Lab 07/13/21 0102  AMMONIA 40*    ABG No results found for: "PHART", "PCO2ART", "PO2ART", "HCO3", "TCO2", "ACIDBASEDEF", "O2SAT"   Coagulation Profile: No results for input(s): "INR", "PROTIME" in the last 168 hours.  Cardiac  Enzymes: No results for input(s): "CKTOTAL", "CKMB", "CKMBINDEX", "TROPONINI" in the last 168 hours.  HbA1C: Hgb A1c MFr Bld  Date/Time Value Ref Range Status  02/17/2021 06:48 PM 5.7 (H) 4.8 - 5.6 % Final    Comment:    (NOTE) Pre diabetes:          5.7%-6.4%  Diabetes:              >6.4%  Glycemic control for   <7.0% adults with diabetes   08/26/2019 11:52 AM 5.8 4.6 - 6.5 % Final    Comment:    Glycemic Control Guidelines for People with Diabetes:Non Diabetic:  <6%Goal of Therapy: <7%Additional Action Suggested:  >8%     CBG: Recent Labs  Lab 07/13/21 2248  GLUCAP 121*    Review of Systems:   Unable to be obtained as he is intubated and sedated.  Past Medical History:  He,  has a past medical history of Alcohol abuse, Anxiety (08/26/2019), Atypical syncope (08/26/2019), Depression, Fainting spell, Fatigue (08/26/2019), Hyperlipidemia, Hypertension, Polysubstance (excluding opioids) dependence ([facility]) (08/26/2019), and Sleep disturbance (08/26/2019).   Surgical History:   Past Surgical History:  Procedure Laterality Date   TYMPANOPLASTY       Social History:   reports that he has been smoking cigarettes. He has been smoking an average of .5 packs per day. He has never used smokeless tobacco. He reports current alcohol use. He reports that he does not currently use drugs after having used the following drugs: Methamphetamines, Cocaine, and Marijuana.   Family History:  His family history includes Depression in his daughter; Early death in his mother; Hyperlipidemia in his daughter; Stroke in his mother.   Allergies No Known Allergies   Home Medications  Prior to Admission medications   Medication Sig Start Date End Date Taking? Authorizing Provider  amLODipine (NORVASC) 5 MG tablet Take 1 tablet (5 mg total) by mouth daily. Patient not taking: Reported on 07/13/2021 02/23/21   Princess Bruins, DO  sertraline (ZOLOFT) 100 MG tablet Take 1 tablet (100 mg total) by mouth  daily. Patient not taking: Reported on 07/13/2021 02/23/21   Princess Bruins, DO     Critical care time: 46 min.    Steffanie Dunn, DO 07/14/21 3:18 AM [facility]

## 2021-07-14 NOTE — ED Notes (Signed)
NEXT OF KIN: FATHER (DEREK Yount) NOTIFIED AND INFORMED OF CONDITION AS WELL AS TRANSFER TO [facility]. CONTINUOUS FRIENDS CALLING ED FOR INFORMATION AS WELL AS WANT TO VISIT PT AND INFORMED THAT NO INFORMATION CAN BE PROVIDED AND NO ONE TO VISIT DUE TO CONSENT BY PT COULD NOT BE OBTAINED. ALL ENCOURAGED TO CONTACT PTS FAMILY FOR UPDATES.

## 2021-07-14 NOTE — Progress Notes (Signed)
Pharmacy Antibiotic Note  Andrew Parsons is a 41 y.o. male admitted on 07/14/2021 with sz after ingestion of large amount of cocaine and methamphetamine, with concern for aspiration pneumonia.  Pharmacy has been consulted for Unasyn dosing.  Plan: Unasyn 3g IV Q6H.  Temp (24hrs), Avg:100.2 F (37.9 C), Min:98.9 F (37.2 C), Max:100.7 F (38.2 C)  Recent Labs  Lab 07/13/21 2228  WBC 11.5*  CREATININE 1.29*    Estimated Creatinine Clearance: 103.2 mL/min (A) (by C-G formula based on SCr of 1.29 mg/dL (H)).    No Known Allergies   Thank you for allowing pharmacy to be a part of this patient's care.  Wynona Neat, PharmD, BCPS  07/14/2021 3:21 AM

## 2021-07-14 NOTE — Progress Notes (Addendum)
  PCCM Progress Note  40 yoM admitted overnight with suspected cocaine and methamphetamine OD with seizures, r/o SE. Unclear if intentional vs accidental.  Hx of polysubstance abuse, depression, and psychiatric admission 02/2021/     Patient remains sedation on propofol with prn fentanyl and versed on cEEG.  No reported clinical seizures.     MRI brain ordered for today.  Labs reviewed> KCL replete underway.   Chart/ vitals reviewed.  Remains hemodynamically stable  Remains on unasyn for possible RLL aspiration pna vs pneumonitis    Overdose of cocaine and methamphetamine Seizures, r/o SE Acute hypoxic respiratory failure related to above RLL aspiration vs pneumonitis  Hypertensive Emergency - resolved Hx depression  Polysubstance abuse  Hypokalemia  P:  - CXR and ABG now - cont full MV support - start TF  - restraints renewed.  - cEEG per Neuro - seizure precautions - MRI brain today when able    Patient's father and son at bedside.  Updated on today's plan of care.  Questions answered.     Additional CCT 20 mins   Addendum:  ok to wean sedation/ extubate per neurology.  No seizures thus far.  Patient therefore was able to be extubated around 10am.  Remains on low dose precedex for agitation> actively weaning.  Given prior psych hx, recent psych admit for SI and substance abuse, can not rule out intentional SI with OD.  1:1 Sitter orders placed and when consistently able to stay awake and not be agitated, will consult psych.    Posey Boyer, ACNP [facility] 07/14/2021, 8:23 AM  See Amion for pager If no response to pager, please call PCCM consult pager After 7:00 pm call [place]

## 2021-07-15 DIAGNOSIS — E876 Hypokalemia: Secondary | ICD-10-CM

## 2021-07-15 DIAGNOSIS — F32A Depression, unspecified: Secondary | ICD-10-CM

## 2021-07-15 DIAGNOSIS — I10 Essential (primary) hypertension: Secondary | ICD-10-CM | POA: Insufficient documentation

## 2021-07-15 DIAGNOSIS — R569 Unspecified convulsions: Secondary | ICD-10-CM

## 2021-07-15 DIAGNOSIS — J690 Pneumonitis due to inhalation of food and vomit: Secondary | ICD-10-CM

## 2021-07-15 DIAGNOSIS — F1990 Other psychoactive substance use, unspecified, uncomplicated: Secondary | ICD-10-CM

## 2021-07-15 DIAGNOSIS — F419 Anxiety disorder, unspecified: Secondary | ICD-10-CM

## 2021-07-15 DIAGNOSIS — J9601 Acute respiratory failure with hypoxia: Secondary | ICD-10-CM

## 2021-07-15 LAB — BASIC METABOLIC PANEL
Anion gap: 8 (ref 5–15)
BUN: 10 mg/dL (ref 6–20)
CO2: 23 mmol/L (ref 22–32)
Calcium: 8.1 mg/dL — ABNORMAL LOW (ref 8.9–10.3)
Chloride: 107 mmol/L (ref 98–111)
Creatinine, Ser: 1.11 mg/dL (ref 0.61–1.24)
GFR, Estimated: >60 mL/min (ref >60)
Glucose, Bld: 124 mg/dL — ABNORMAL HIGH (ref 70–99)
Potassium: 3.3 mmol/L — ABNORMAL LOW (ref 3.5–5.1)
Sodium: 138 mmol/L (ref 135–145)

## 2021-07-15 LAB — CBC
HCT: 37.6 % — ABNORMAL LOW (ref 39.0–52.0)
Hemoglobin: 13.2 g/dL (ref 13.0–17.0)
MCH: 30.9 pg (ref 26.0–34.0)
MCHC: 35.1 g/dL (ref 30.0–36.0)
MCV: 88.1 fL (ref 80.0–100.0)
Platelets: 175 10*3/uL (ref 150–400)
RBC: 4.27 MIL/uL (ref 4.22–5.81)
RDW: 13.1 % (ref 11.5–15.5)
WBC: 7.6 10*3/uL (ref 4.0–10.5)
nRBC: 0 % (ref 0.0–0.2)

## 2021-07-15 LAB — CK: Total CK: 506 U/L — ABNORMAL HIGH (ref 49–397)

## 2021-07-15 LAB — MAGNESIUM: Magnesium: 2 mg/dL (ref 1.7–2.4)

## 2021-07-15 LAB — GLUCOSE, CAPILLARY: Glucose-Capillary: 106 mg/dL — ABNORMAL HIGH (ref 70–99)

## 2021-07-15 MED ORDER — THIAMINE HCL 100 MG PO TABS: 100.0000 mg | ORAL_TABLET | Freq: Every day | ORAL | Status: DC

## 2021-07-15 MED ORDER — PANTOPRAZOLE SODIUM 40 MG PO TBEC: 40.0000 mg | DELAYED_RELEASE_TABLET | Freq: Every day | ORAL | Status: DC

## 2021-07-15 MED ORDER — POTASSIUM CHLORIDE CRYS ER 20 MEQ PO TBCR
40.0000 meq | EXTENDED_RELEASE_TABLET | ORAL | Status: DC
  Administered 2021-07-15: 40 meq via ORAL
  Filled 2021-07-15: qty 2

## 2021-07-15 MED ORDER — POLYETHYLENE GLYCOL 3350 17 G PO PACK: 17.0000 g | PACK | Freq: Every day | ORAL | Status: DC | PRN

## 2021-07-15 MED ORDER — DOCUSATE SODIUM 50 MG/5ML PO LIQD: 100.0000 mg | Freq: Two times a day (BID) | ORAL | Status: DC | PRN

## 2021-07-15 MED ORDER — AMLODIPINE BESYLATE 5 MG PO TABS: 5.0000 mg | ORAL_TABLET | Freq: Every day | ORAL | 1 refills | Status: AC

## 2021-07-15 MED ORDER — AMOXICILLIN-POT CLAVULANATE 875-125 MG PO TABS: 1.0000 | ORAL_TABLET | Freq: Two times a day (BID) | ORAL | 0 refills | Status: AC

## 2021-07-15 MED ORDER — AMLODIPINE BESYLATE 5 MG PO TABS: 5.0000 mg | ORAL_TABLET | Freq: Every day | ORAL | Status: DC

## 2021-07-15 MED ORDER — FOLIC ACID 1 MG PO TABS: 1.0000 mg | ORAL_TABLET | Freq: Every day | ORAL | Status: DC

## 2021-07-15 MED ORDER — ENOXAPARIN SODIUM 60 MG/0.6ML IJ SOSY
60.0000 mg | PREFILLED_SYRINGE | INTRAMUSCULAR | Status: DC
  Filled 2021-07-15: qty 0.6

## 2021-07-15 MED ORDER — ADULT MULTIVITAMIN W/MINERALS CH: 1.0000 | ORAL_TABLET | Freq: Every day | ORAL | Status: DC

## 2021-07-15 NOTE — Evaluation (Signed)
Occupational Therapy Evaluation Patient Details Name: Andrew Parsons MRN: [id number] DOB: August 08, 1980 Today's Date: 07/15/2021   History of Present Illness 41 y/o gentleman with a history of depression and psychiatric admission in January 2023 who presented with weakness and inability to stand up . He was brought into the ED by a friend who reported he had taken a large amount of cocaine and methamphetamines.  In the ED he developed a tonic clonic seizure and required intubation. Extubated 6/8   Clinical Impression   PTA pt lives independently with a friend, has 2 grown children and had planned to start work this week. Mobilizing and completing ADL tasks with overall set up/S. Pt amnestic to the event, however after discussing the situation, pt admits to using cocaine and being drunk and the last thing he remembered was being  "on all fours and really sick". States he was "really angry" and that is how he handled it. As expected, pt with slow processing and decreased short term memory postictal  and post extubation, however anticipate pt will return to baseline. Discussed need for follow up counseling to learn coping skills in addition addressing to drug/alcohol use. Pt in agreement for need for counseling after DC - nsg made aware. Anticipate pt will be seen by psych. Recommend pt have S with financial and medication management after DC. Discussed with pt's Dad. Pt asking if he will be able to drive - deferred to MD. No further OT needs.      Recommendations for follow up therapy are one component of a multi-disciplinary discharge planning process, led by the attending physician.  Recommendations may be updated based on patient status, additional functional criteria and insurance authorization.   Follow Up Recommendations  Other (comment) (outpt counseling for phsych/drug/alcohol needs, or as indicated by [facility])    Assistance Recommended at Discharge Intermittent Supervision/Assistance (S with  financial management - discussed with Dad; unsure if pt able to drive - needs to discuss with MD)  Patient can return home with the following Direct supervision/assist for financial management;Direct supervision/assist for medications management;Assist for transportation    Functional Status Assessment   (no further OT needed)  Equipment Recommendations  None recommended by OT    Recommendations for Other Services Other (comment) (SW/CM for counseling)     Precautions / Restrictions Precautions Precautions: Fall Restrictions Weight Bearing Restrictions: No      Mobility Bed Mobility Overal bed mobility: Modified Independent                  Transfers Overall transfer level: Needs assistance Equipment used: None Transfers: Sit to/from Stand Sit to Stand: Supervision                  Balance Overall balance assessment: Needs assistance Sitting-balance support: No upper extremity supported, Feet supported Sitting balance-Leahy Scale: Good     Standing balance support: No upper extremity supported, During functional activity Standing balance-Leahy Scale: Good           Rhomberg - Eyes Opened:  (stopped at 15 seconds to progress to eyes closed)       Standardized Balance Assessment Standardized Balance Assessment : Dynamic Gait Index         ADL either performed or assessed with clinical judgement   ADL Overall ADL's : Needs assistance/impaired  General ADL Comments: Overall S with mobility and ADL tasks     Vision Baseline Vision/History: 0 No visual deficits Vision Assessment?: No apparent visual deficits     Perception     Praxis      Pertinent Vitals/Pain Pain Assessment Faces Pain Scale: Hurts a little bit Pain Location: legs Pain Descriptors / Indicators: Sore     Hand Dominance Right   Extremity/Trunk Assessment Upper Extremity Assessment Upper Extremity Assessment:  Overall WFL for tasks assessed   Lower Extremity Assessment Lower Extremity Assessment: Defer to PT evaluation   Cervical / Trunk Assessment Cervical / Trunk Assessment: Normal   Communication Communication Communication: No difficulties   Cognition Arousal/Alertness: Awake/alert (brief periods of falling asleep) Behavior During Therapy: WFL for tasks assessed/performed Overall Cognitive Status: Impaired/Different from baseline Area of Impairment: Problem solving, Memory                     Memory: Decreased short-term memory       Problem Solving: Slow processing       General Comments  VSS RA    Exercises     Shoulder Instructions      Home Living Family/patient expects to be discharged to:: Private residence Living Arrangements: Non-relatives/Friends Available Help at Discharge: Friend(s) Type of Home: Apartment Home Access: Stairs to enter Secretary/administrator of Steps: 1 Entrance Stairs-Rails: None Home Layout: One level     Bathroom Shower/Tub: IT trainer: Standard Bathroom Accessibility: Yes   Home Equipment: None          Prior Functioning/Environment Prior Level of Function : Independent/Modified Independent             Mobility Comments: was about to start a new job wiht [employer] before admission          OT Problem List: Decreased safety awareness;Decreased cognition      OT Treatment/Interventions:      OT Goals(Current goals can be found in the care plan section) Acute Rehab OT Goals Patient Stated Goal: to go home and get a job OT Goal Formulation: All assessment and education complete, DC therapy  OT Frequency:      Co-evaluation              AM-PAC OT "6 Clicks" Daily Activity     Outcome Measure Help from another person eating meals?: None Help from another person taking care of personal grooming?: None Help from another person toileting, which includes using  toliet, bedpan, or urinal?: None Help from another person bathing (including washing, rinsing, drying)?: None Help from another person to put on and taking off regular upper body clothing?: None Help from another person to put on and taking off regular lower body clothing?: A Little 6 Click Score: 23   End of Session Equipment Utilized During Treatment: Gait belt Nurse Communication: Other (comment) (DC needs)  Activity Tolerance: Patient tolerated treatment well Patient left: in chair;with call bell/phone within reach;with family/visitor present  OT Visit Diagnosis: Unsteadiness on feet (R26.81);Other symptoms and signs involving cognitive function                Time: 2330-0762 OT Time Calculation (min): 23 min Charges:  OT General Charges $OT Visit: 1 Visit OT Evaluation $OT Eval Moderate Complexity: 1 Mod OT Treatments $Self Care/Home Management : 8-22 mins  Luisa Dago, OT/L   Acute OT Clinical Specialist Acute Rehabilitation Services Pager [phone] Office [phone]   [facility] 07/15/2021,  10:37 AM

## 2021-07-15 NOTE — Progress Notes (Signed)
Subjective: No further seizures, feels back to normal  Exam: Vitals:   07/15/21 0652 07/15/21 0800  BP: (!) 150/88 127/80  Pulse: 88 87  Resp: 18 16  Temp:    SpO2: 97% 97%   Gen: In bed, NAD Resp: non-labored breathing, no acute distress Abd: soft, nt  Neuro: MS: Awake, alert, oriented and appropriate CN: Pupils equal round reactive, visual fields full, face symmetric Motor: No drift, mild limitation due to diffuse muscle soreness Sensory: Intact light touch  Pertinent Labs: Cr 1.29  Impression: 41 yo M with provoked seizure 2/2 cocaine and amphetamines. I have advised him to avoid stimulants in the future.   Recommendations: 1) Avoid drugs of abuse.   Ritta Slot, MD [facility] [phone]  If 7pm- 7am, please page neurology on call as listed in AMION.

## 2021-07-15 NOTE — Assessment & Plan Note (Addendum)
Initially concern about status epilepticus.  This is in the setting of cocaine and methamphetamine use.  No seizure activity in the hospital.  Spot & LTM EEG did not show seizure activity or epileptiform discharge.  Cleared by neurology for discharge.  Was given seizure precaution including not driving until seizure-free for 6 months

## 2021-07-15 NOTE — Discharge Summary (Signed)
Physician Discharge Summary  Andrew Parsons [id number] DOB: 1980-09-20 DOA: 07/14/2021  PCP: Pcp, No  Admit date: 07/14/2021 Discharge date: 07/15/2021 Admitted From: Home Disposition: Home Recommendations for Outpatient Follow-up:  Follow ups as below. Please obtain CBC/BMP/Mag at follow up Please follow up on the following pending results: None  Home Health: Not indicated Equipment/Devices: Not indicated  Discharge Condition: Stable CODE STATUS: Full code  Follow-up Information     [place]. Call.   Why: Call for intake appointment Contact information: [address] [place], [place] [address]  Phone: [phone]        [place]. Call.   Why: Clinic will call you for hospital follow up appointment.  This clinic accepts patients that have no insurance Contact information: [address] [place] [phone] [facility] 41 year old M with history of polysubstance use, depression, EtOH and HTN presented to [facility] with seizures after cocaine and methamphetamine overdose, and admitted to neuro ICU at [facility] to rule out status epilepticus.  He desaturated to 81% on RA.  He was tachycardic to 50s and 60s.  He was intubated and started on mechanical ventilation.  CT head, chest, abdomen and pelvis without significant finding other than RLL collapse.  He was started on IV Unasyn for possible aspiration pneumonia versus pneumonitis.  Patient was evaluated by neurology.  Spot and LTM EEG negative for seizure or epileptiform discharge.  Patient was extubated 4 L by nasal cannula the next morning, and transferred [facility] on the day of discharge (07/15/2021).  Patient has not had seizure-like activity during hospital stay.  He was cleared for discharge by neurology.  He was weaned to room air.  He was evaluated and cleared by PT/OT.    At  some point, patient responded yes when he was asked about trying to hurt himself.  Suicide safety sitter was ordered and psychiatry was consulted.  However, patient denied suicidal ideation when he was at the same question about 30 minutes later or the following morning.  He said the overdose was recreational, not to end his life.  Patient was counseled on the importance of stopping recreational drug use.  He was provided with resources.       See individual problem list below for more on hospital course.  Problems addressed during this hospitalization Problem  Seizure-Like Activity (Hcc)  Polysubstance Use Disorder  Uncontrolled Hypertension  Hypokalemia  Acute Respiratory Failure With Hypoxia (Hcc)  Aspiration Pneumonia (Hcc)  Anxiety and Depression    Assessment and Plan: * Seizure-like activity ([place]) Initially concern about status epilepticus.  This is in the setting of cocaine and methamphetamine use.  No seizure activity in the hospital.  Spot & LTM EEG did not show seizure activity or epileptiform discharge.  Cleared by neurology for discharge.  Was given seizure precaution including not driving until seizure-free for 6 months  Aspiration pneumonia ([place]) Imaging concerning for RLL aspiration pneumonia.  Discharged on p.o. Augmentin to complete treatment course.  Acute respiratory failure with hypoxia (HCC) In the setting of seizure?  Chest x-ray concerning for RLL aspiration pneumonia.  Intubated and extubated.  Maintain appropriate saturation on room air without respiratory distress. -Discharge on p.o. Augmentin to complete treatment course.  Hypokalemia Replenished prior to discharge.  Uncontrolled hypertension Improved.  Normotensive.  Discharged on home amlodipine.  Polysubstance use  disorder Counseled and provided with resources.  Anxiety and depression Patient denies intentional overdose, suicidal ideation or homicidal ideation.  Safety sitter discontinued.   Provided with resources for outpatient psychiatric follow-up    Vital signs Vitals:   07/15/21 0500 07/15/21 0600 07/15/21 0652 07/15/21 0800  BP:   (!) 150/88 127/80  Pulse: 72 75 88 87  Temp:      Resp: 18 14 18 16   SpO2: 99% 98% 97% 97%  TempSrc:         Discharge exam  GENERAL: No apparent distress.  Nontoxic. HEENT: MMM.  Vision and hearing grossly intact.  NECK: Supple.  No apparent JVD.  RESP:  No IWOB.  Fair aeration bilaterally. CVS:  RRR. Heart sounds normal.  ABD/GI/GU: BS+. Abd soft, NTND.  MSK/EXT:  Moves extremities. No apparent deformity. No edema.  SKIN: no apparent skin lesion or wound NEURO: Awake and alert. Oriented appropriately.  No apparent focal neuro deficit. PSYCH: Calm. Normal affect.   Discharge Instructions Discharge Instructions     Call MD for:  difficulty breathing, headache or visual disturbances   Complete by: As directed    Call MD for:  extreme fatigue   Complete by: As directed    Call MD for:  persistant dizziness or light-headedness   Complete by: As directed    Call MD for:  persistant nausea and vomiting   Complete by: As directed    Call MD for:  severe uncontrolled pain   Complete by: As directed    Call MD for:  temperature >100.4   Complete by: As directed    Diet - low sodium heart healthy   Complete by: As directed    Discharge instructions   Complete by: As directed    It has been a pleasure taking care of you!  You were hospitalized due to seizure-like activity in the setting of cocaine and methamphetamine intoxication.  It is very important that you stop using recreational drugs.  There is also a concern about right lung pneumonia for which you have been started on antibiotics.  We are discharging you on more antibiotics to complete treatment course.  It is very important that you take the whole course of antibiotic regardless of improvement.   Follow-up with your primary care doctor in 1 to 2 weeks or sooner if  needed.  Per [facility] statutes, patients with seizures are not allowed to drive until  they have been seizure-free for six months. Use caution when using heavy equipment or power tools. Avoid working on ladders or at heights. Take showers instead of baths. Ensure the water temperature is not too high on the home water heater. Do not go swimming alone. When caring for infants or small children, sit down when holding, feeding, or changing them to minimize risk of injury to the child in the event you have a seizure.  To reduce risk of seizures, maintain good sleep hygiene avoid alcohol and illicit drug use, take all anti-seizure medications as prescribed.     Take care,   Increase activity slowly   Complete by: As directed       Allergies as of 07/15/2021       Reactions   Hydrocodone Nausea And Vomiting   Oxycodone Nausea And Vomiting        Medication List     TAKE these medications    amLODipine 5 MG tablet Commonly known as: NORVASC Take 1 tablet (5 mg total) by mouth daily.  amoxicillin-clavulanate 875-125 MG tablet Commonly known as: AUGMENTIN Take 1 tablet by mouth 2 (two) times daily for 4 days.   sertraline 100 MG tablet Commonly known as: ZOLOFT Take 1 tablet (100 mg total) by mouth daily.        Consultations: Neurology Pulmonology  Procedures/Studies: Intubation and extubation.   DG Chest Port 1 View  Result Date: 07/14/2021 CLINICAL DATA:  Follow-up ventilator support EXAM: PORTABLE CHEST 1 VIEW COMPARISON:  07/13/2021 FINDINGS: Endotracheal tube tip 4 cm above the carina. Nasogastric or orogastric tube enters the abdomen. Worsened volume loss in both lower lobes compared to yesterday's films. Upper lungs remain clear. IMPRESSION: Worsened volume loss in both lower lobes. Electronically Signed   By: Nelson Chimes M.D.   On: 07/14/2021 08:33   Overnight EEG with video  Result Date: 07/14/2021 Lora Havens, MD     07/14/2021  8:36 PM Patient  Name: Andrew Parsons MRN: [id number] Epilepsy Attending: Lora Havens Referring Physician/Provider: Corey Harold, NP Duration: 07/14/2021 [id number]  Patient history: 10 yoM admitted overnight with suspected cocaine and methamphetamine OD with seizures, r/o SE. EEG to evaluate for seizure  Level of alertness:  comatose  AEDs during EEG study: Propofol, Versed  Technical aspects: This EEG study was done with scalp electrodes positioned according to the 10-20 International system of electrode placement. Electrical activity was acquired at a sampling rate of 500Hz  and reviewed with a high frequency filter of 70Hz  and a low frequency filter of 1Hz . EEG data were recorded continuously and digitally stored.  Description: EEG initially showed intermittent generalized 5 to 6 Hz theta slowing admixed with generalized 12 to 13 Hz beta activity. As sedation was weaned, EEG improved and showed posterior dominant rhythm of 9-10 Hz activity of moderate voltage (25-35 uV) seen predominantly in posterior head regions, symmetric and reactive to eye opening and eye closing. Sleep was characterized by vertex waves, sleep spindles (12 to 14 Hz), maximal frontocentral region.  Hyperventilation and photic stimulation were not performed.    ABNORMALITY - Intermittent slow, generalized  IMPRESSION: This study was initially suggestive of moderate diffuse encephalopathy, nonspecific etiology but likely related to sedation. As sedation was improved, EEG improved and was within normal limits. No seizures or epileptiform discharges were seen throughout the recording.  Lora Havens   EEG adult  Result Date: 07/14/2021 Lora Havens, MD     07/14/2021  8:28 AM Patient Name: Dashiel Wessman MRN: [id number] Epilepsy Attending: Lora Havens Referring Physician/Provider: Corey Harold, NP Date: 07/14/2021 Duration: 24.17 mins Patient history: 79 yoM admitted overnight with suspected cocaine and methamphetamine OD with seizures, r/o SE. EEG  to evaluate for seizure Level of alertness:  comatose AEDs during EEG study: Propofol, Versed Technical aspects: This EEG study was done with scalp electrodes positioned according to the 10-20 International system of electrode placement. Electrical activity was acquired at a sampling rate of 500Hz  and reviewed with a high frequency filter of 70Hz  and a low frequency filter of 1Hz . EEG data were recorded continuously and digitally stored. Description: EEG showed continuous generalized 2 to 3 Hz delta slowing admixed with an excessive amount of 15 to 18 Hz beta activity distributed symmetrically and diffusely. Hyperventilation and photic stimulation were not performed.   ABNORMALITY - Continuous slow, generalized - Excessive beta, generalized IMPRESSION: This study is suggestive of severe diffuse encephalopathy, nonspecific etiology but likely related to sedation. No seizures or epileptiform discharges were seen throughout the recording. Priyanka O  Yadav   CT Angio Chest/Abd/Pel for Dissection W and/or Wo Contrast  Result Date: 07/14/2021 CLINICAL DATA:  Cocaine and methamphetamine use EXAM: CT ANGIOGRAPHY CHEST, ABDOMEN AND PELVIS TECHNIQUE: Non-contrast CT of the chest was initially obtained. Multidetector CT imaging through the chest, abdomen and pelvis was performed using the standard protocol during bolus administration of intravenous contrast. Multiplanar reconstructed images and MIPs were obtained and reviewed to evaluate the vascular anatomy. RADIATION DOSE REDUCTION: This exam was performed according to the departmental dose-optimization program which includes automated exposure control, adjustment of the mA and/or kV according to patient size and/or use of iterative reconstruction technique. CONTRAST:  15mL OMNIPAQUE IOHEXOL 350 MG/ML SOLN COMPARISON:  None Available. FINDINGS: CTA CHEST FINDINGS Cardiovascular: --Heart: The heart size is normal.  There is nopericardial effusion. --Aorta: The course and  caliber of the thoracic aorta are normal. There is no aortic atherosclerotic calcification. Precontrast images show no aortic intramural hematoma. There is no blood pool, dissection or penetrating ulcer demonstrated on arterial phase postcontrast imaging. There is a conventional 3 vessel aortic arch branching pattern. The proximal arch vessels are widely patent. --Pulmonary Arteries: Contrast timing is optimized for preferential opacification of the aorta. Within that limitation, normal central pulmonary arteries. Mediastinum/Nodes: No mediastinal, hilar or axillary lymphadenopathy. The visualized thyroid and thoracic esophageal course are unremarkable. Lungs/Pleura: Endotracheal tube tip is at the level of the clavicular heads. There is right lower lobe collapse. Musculoskeletal: No chest wall abnormality. No acute osseous findings. Review of the MIP images confirms the above findings. CTA ABDOMEN AND PELVIS FINDINGS VASCULAR Aorta: Normal caliber aorta without aneurysm, dissection, vasculitis or hemodynamically significant stenosis. There is no aortic atherosclerosis. Celiac: No aneurysm, dissection or hemodynamically significant stenosis. Normal branching pattern. SMA: Widely patent without dissection or stenosis. Renals: Single renal arteries bilaterally. No aneurysm, dissection, stenosis or evidence of fibromuscular dysplasia. IMA: Patent without abnormality. Inflow: No aneurysm, stenosis or dissection. Veins: Normal course and caliber of the major veins. Assessment is otherwise limited by the arterial dominant contrast phase. Review of the MIP images confirms the above findings. NON-VASCULAR Hepatobiliary: Normal hepatic contours and density. No visible biliary dilatation. Normal gallbladder. Pancreas: Normal contours without ductal dilatation. No peripancreatic fluid collection. Spleen: Normal arterial phase splenic enhancement pattern. Adrenals/Urinary Tract: --Adrenal glands: Normal. --Right kidney/ureter:  No hydronephrosis or perinephric stranding. No nephrolithiasis. No obstructing ureteral stones. --Left kidney/ureter: No hydronephrosis or perinephric stranding. No nephrolithiasis. No obstructing ureteral stones. --Urinary bladder: Unremarkable. Stomach/Bowel: --Stomach/Duodenum: No hiatal hernia or other gastric abnormality. Normal duodenal course and caliber. --Small bowel: No dilatation or inflammation. --Colon: No focal abnormality. --Appendix: Normal. Lymphatic:  No abdominal or pelvic lymphadenopathy. Reproductive: Normal prostate and seminal vesicles. Musculoskeletal. No bony spinal canal stenosis or focal osseous abnormality. Other: Incidental right abdominal wall intramuscular lipoma. Review of the MIP images confirms the above findings. IMPRESSION: 1. No acute aortic syndrome. 2. Right lower lobe collapse. 3. No acute abnormality of the abdomen pelvis. Electronically Signed   By: Ulyses Jarred M.D.   On: 07/14/2021 00:20   CT HEAD WO CONTRAST (5MM)  Result Date: 07/14/2021 CLINICAL DATA:  Altered mental status EXAM: CT HEAD WITHOUT CONTRAST TECHNIQUE: Contiguous axial images were obtained from the base of the skull through the vertex without intravenous contrast. RADIATION DOSE REDUCTION: This exam was performed according to the departmental dose-optimization program which includes automated exposure control, adjustment of the mA and/or kV according to patient size and/or use of iterative reconstruction technique. COMPARISON:  None Available. FINDINGS: Brain:  There is no mass, hemorrhage or extra-axial collection. The size and configuration of the ventricles and extra-axial CSF spaces are normal. The brain parenchyma is normal, without acute or chronic infarction. Vascular: No abnormal hyperdensity of the major intracranial arteries or dural venous sinuses. No intracranial atherosclerosis. Skull: The visualized skull base, calvarium and extracranial soft tissues are normal. Sinuses/Orbits: No fluid  levels or advanced mucosal thickening of the visualized paranasal sinuses. No mastoid or middle ear effusion. The orbits are normal. IMPRESSION: Normal head CT. Electronically Signed   By: Ulyses Jarred M.D.   On: 07/14/2021 00:14   DG Chest Portable 1 View  Result Date: 07/13/2021 CLINICAL DATA:  Status post intubation, possible drug overdose EXAM: PORTABLE CHEST 1 VIEW COMPARISON:  None Available. FINDINGS: Cardiac shadow is within normal limits. Endotracheal tube and gastric catheter are noted in position. The gastric catheter shows proximal side port at the gastroesophageal junction. This could be advanced slightly further into the stomach. The lungs are clear. No bony abnormality is noted. IMPRESSION: Tubes and lines as described above. Gastric catheter could be advanced deeper into the stomach. No other focal abnormality is noted. Electronically Signed   By: Inez Catalina M.D.   On: 07/13/2021 23:01       The results of significant diagnostics from this hospitalization (including imaging, microbiology, ancillary and laboratory) are listed below for reference.     Microbiology: Recent Results (from the past 240 hour(s))  MRSA Next Gen by PCR, Nasal     Status: None   Collection Time: 07/14/21  3:43 AM   Specimen: Nasal Mucosa; Nasal Swab  Result Value Ref Range Status   MRSA by PCR Next Gen NOT DETECTED NOT DETECTED Final    Comment: (NOTE) The GeneXpert MRSA Assay (FDA approved for NASAL specimens only), is one component of a comprehensive MRSA colonization surveillance program. It is not intended to diagnose MRSA infection nor to guide or monitor treatment for MRSA infections. Test performance is not FDA approved in patients less than 85 years old. Performed at [facility], [place] [address], [place], [place] [address]      Labs:  CBC: Recent Labs  Lab 07/13/21 2228 07/14/21 0416 07/14/21 0901 07/15/21 0404  WBC 11.5* 8.1  --  7.6  NEUTROABS 6.4 6.4  --   --   HGB  15.1 12.5* 12.2* 13.2  HCT 44.0 34.8* 36.0* 37.6*  MCV 86.4 86.1  --  88.1  PLT 273 146*  --  175   BMP &GFR Recent Labs  Lab 07/13/21 2228 07/14/21 0416 07/14/21 0901 07/14/21 0947 07/15/21 0404  NA 139 137 140 141 138  K 3.2* 3.0* 3.5 3.6 3.3*  CL 108 111  --  112* 107  CO2 20* 21*  --  23 23  GLUCOSE 120* 114*  --  88 124*  BUN 24* 16  --  13 10  CREATININE 1.29* 1.35*  --  1.29* 1.11  CALCIUM 9.3 8.3*  --  8.3* 8.1*  MG 2.2 2.5*  --   --  2.0  PHOS  --  3.3  --   --   --    Estimated Creatinine Clearance: 119.9 mL/min (by C-G formula based on SCr of 1.11 mg/dL). Liver & Pancreas: Recent Labs  Lab 07/13/21 2228 07/14/21 0416  AST 34 37  ALT 33 40  ALKPHOS 71 56  BILITOT 1.2 1.4*  PROT 8.4* 5.7*  ALBUMIN 4.7 3.2*   No results for input(s): "LIPASE", "AMYLASE"  in the last 168 hours. Recent Labs  Lab 07/13/21 0102  AMMONIA 40*   Diabetic: No results for input(s): "HGBA1C" in the last 72 hours. Recent Labs  Lab 07/14/21 0412 07/14/21 0814 07/14/21 1132 07/14/21 1547 07/15/21 0729  GLUCAP 123* 76 104* 79 106*   Cardiac Enzymes: Recent Labs  Lab 07/14/21 0444 07/15/21 0404  CKTOTAL 636* 506*   No results for input(s): "PROBNP" in the last 8760 hours. Coagulation Profile: Recent Labs  Lab 07/14/21 0416  INR 1.2   Thyroid Function Tests: No results for input(s): "TSH", "T4TOTAL", "FREET4", "T3FREE", "THYROIDAB" in the last 72 hours. Lipid Profile: No results for input(s): "CHOL", "HDL", "LDLCALC", "TRIG", "CHOLHDL", "LDLDIRECT" in the last 72 hours. Anemia Panel: No results for input(s): "VITAMINB12", "FOLATE", "FERRITIN", "TIBC", "IRON", "RETICCTPCT" in the last 72 hours. Urine analysis:    Component Value Date/Time   COLORURINE YELLOW 07/14/2021 0529   APPEARANCEUR CLOUDY (A) 07/14/2021 0529   LABSPEC <1.005 (L) 07/14/2021 0529   PHURINE 6.0 07/14/2021 0529   GLUCOSEU NEGATIVE 07/14/2021 0529   HGBUR TRACE (A) 07/14/2021 0529    BILIRUBINUR NEGATIVE 07/14/2021 0529   KETONESUR NEGATIVE 07/14/2021 0529   PROTEINUR NEGATIVE 07/14/2021 0529   NITRITE NEGATIVE 07/14/2021 0529   LEUKOCYTESUR NEGATIVE 07/14/2021 0529   Sepsis Labs: Invalid input(s): "PROCALCITONIN", "LACTICIDVEN"   Time coordinating discharge: 45 minutes  SIGNED:  Mercy Riding, MD  [facility] 07/15/2021, 5:51 PM

## 2021-07-15 NOTE — Assessment & Plan Note (Signed)
Improved.  Normotensive.  Discharged on home amlodipine.

## 2021-07-15 NOTE — Assessment & Plan Note (Signed)
Imaging concerning for RLL aspiration pneumonia.  Discharged on p.o. Augmentin to complete treatment course.

## 2021-07-15 NOTE — TOC Transition Note (Signed)
Transition of Care [facility]) - CM/SW Discharge Note   Patient Details  Name: Andrew Parsons MRN: [id number] Date of Birth: [birth year]  Transition of Care [facility] CM/SW Contact:  Glennon Mac, RN Phone Number: 07/15/2021, 11:52 AM   Clinical Narrative:    41 y/o gentleman with a history of depression and psychiatric admission in January 2023 who presented with weakness and inability to stand up . He was brought into the ED by a friend who reported he had taken a large amount of cocaine and methamphetamines.  In the ED he developed a tonic clonic seizure and required intubation. Extubated 6/8. PTA, pt independent and living at home with a friend.  He is medically stable for discharge home today.  PT/OT recommending no OP follow up.  Dad at bedside for transport home.  Attempted to make hospital f/u/PCP appointment with [facility], but scheduler has to follow up with a nurse to get an earlier appt.  She states she will call patient/family with appt information once confirmed.  CSW to follow up at bedside for SA counseling and resources.    Final next level of care: Home/Self Care Barriers to Discharge: Barriers Resolved                                                           Social Determinants of Health (SDOH) Interventions     Readmission Risk Interventions     No data to display         Quintella Baton, RN, BSN  Trauma/Neuro ICU Case Manager [phone]

## 2021-07-15 NOTE — Assessment & Plan Note (Signed)
Counseled and provided with resources.

## 2021-07-15 NOTE — Assessment & Plan Note (Signed)
In the setting of seizure?  Chest x-ray concerning for RLL aspiration pneumonia.  Intubated and extubated.  Maintain appropriate saturation on room air without respiratory distress. -Discharge on p.o. Augmentin to complete treatment course.

## 2021-07-15 NOTE — Assessment & Plan Note (Signed)
Patient denies intentional overdose, suicidal ideation or homicidal ideation.  Safety sitter discontinued.  Provided with resources for outpatient psychiatric follow-up

## 2021-07-15 NOTE — Hospital Course (Signed)
41 year old M with history of polysubstance use, depression, EtOH and HTN presented to [facility] with seizures after cocaine and methamphetamine overdose, and admitted to neuro ICU at [facility] to rule out status epilepticus.  He desaturated to 81% on RA.  He was tachycardic to 50s and 60s.  He was intubated and started on mechanical ventilation.  CT head, chest, abdomen and pelvis without significant finding other than RLL collapse.  He was started on IV Unasyn for possible aspiration pneumonia versus pneumonitis.  Patient was evaluated by neurology.  Spot and LTM EEG negative for seizure or epileptiform discharge.  Patient was extubated 4 L by nasal cannula the next morning, and transferred to [facility] on the day of discharge (07/15/2021).  Patient has not had seizure-like activity during hospital stay.  He was cleared for discharge by neurology.  He was weaned to room air.  He was evaluated and cleared by PT/OT.    At some point, patient responded yes when he was asked about trying to hurt himself.  Suicide safety sitter was ordered and psychiatry was consulted.  However, patient denied suicidal ideation when he was at the same question about 30 minutes later or the following morning.  He said the overdose was recreational, not to end his life.  Patient was counseled on the importance of stopping recreational drug use.  He was provided with resources.

## 2021-07-15 NOTE — Evaluation (Signed)
Physical Therapy Evaluation Patient Details Name: Andrew Parsons MRN: [id number] DOB: 02/15/1980 Today's Date: 07/15/2021  History of Present Illness  41 y/o gentleman with a history of depression and psychiatric admission in January 2023 who presented with weakness and inability to stand up . He was brought into the ED by a friend who reported he had taken a large amount of cocaine and methamphetamines.  In the ED he developed a tonic clonic seizure and required intubation.  Clinical Impression  Pt presents to PT with deficits in balance and gait. Gait quality improves with progressive ambulation, pt does not require physical assistance. Pt anticipates gait and balance will continue to improve quickly with further mobilization. Pt also presents with slowed processing and impaired memory, intermittently falling asleep during history and with difficulty recalling room number during session. PT recommends discharge home, no post-acute PT recommended. Pt is in agreement with this plan.     Recommendations for follow up therapy are one component of a multi-disciplinary discharge planning process, led by the attending physician.  Recommendations may be updated based on patient status, additional functional criteria and insurance authorization.  Follow Up Recommendations No PT follow up    Assistance Recommended at Discharge Intermittent Supervision/Assistance  Patient can return home with the following  A little help with bathing/dressing/bathroom;Assistance with cooking/housework;Direct supervision/assist for medications management;Direct supervision/assist for financial management;Assist for transportation    Equipment Recommendations None recommended by PT  Recommendations for Other Services       Functional Status Assessment Patient has had a recent decline in their functional status and demonstrates the ability to make significant improvements in function in a reasonable and predictable amount of  time.     Precautions / Restrictions Precautions Precautions: Fall Restrictions Weight Bearing Restrictions: No      Mobility  Bed Mobility                    Transfers Overall transfer level: Needs assistance Equipment used: None Transfers: Sit to/from Stand Sit to Stand: Supervision                Ambulation/Gait Ambulation/Gait assistance: Supervision Gait Distance (Feet): 400 Feet Assistive device: None Gait Pattern/deviations: Step-through pattern, Drifts right/left Gait velocity: functional Gait velocity interpretation: 1.31 - 2.62 ft/sec, indicative of limited community ambulator   General Gait Details: pt with increased lateral drift initially, able to tolerate dynamic gait challenges as listed in DGI section  Stairs Stairs: Yes Stairs assistance: Supervision Stair Management: One rail Left, Alternating pattern, Forwards Number of Stairs: 10    Wheelchair Mobility    Modified Rankin (Stroke Patients Only)       Balance Overall balance assessment: Needs assistance Sitting-balance support: No upper extremity supported, Feet supported Sitting balance-Leahy Scale: Good     Standing balance support: No upper extremity supported, During functional activity Standing balance-Leahy Scale: Good     Single Leg Stance - Left Leg: 5     Rhomberg - Eyes Opened: 15 (stopped at 15 seconds to progress to eyes closed) Rhomberg - Eyes Closed: 30     Standardized Balance Assessment Standardized Balance Assessment : Dynamic Gait Index   Dynamic Gait Index Level Surface: Normal Change in Gait Speed: Normal Gait with Horizontal Head Turns: Mild Impairment Gait with Vertical Head Turns: Mild Impairment Gait and Pivot Turn: Mild Impairment Step Over Obstacle: Mild Impairment Step Around Obstacles: Normal Steps: Mild Impairment Total Score: 19       Pertinent Vitals/Pain Pain Assessment Pain Assessment:  Faces Faces Pain Scale: Hurts a  little bit Pain Location: legs Pain Descriptors / Indicators: Sore Pain Intervention(s): Monitored during session    Home Living Family/patient expects to be discharged to:: Private residence Living Arrangements: Non-relatives/Friends Available Help at Discharge: Friend(s) Type of Home: Apartment Home Access: Stairs to enter Entrance Stairs-Rails: None Entrance Stairs-Number of Steps: 1   Home Layout: One level Home Equipment: None      Prior Function Prior Level of Function : Independent/Modified Independent             Mobility Comments: was about to start a new job before admission       Hand Dominance        Extremity/Trunk Assessment   Upper Extremity Assessment Upper Extremity Assessment: Overall WFL for tasks assessed    Lower Extremity Assessment Lower Extremity Assessment: Overall WFL for tasks assessed    Cervical / Trunk Assessment Cervical / Trunk Assessment: Normal  Communication   Communication: No difficulties  Cognition Arousal/Alertness: Awake/alert (brief periods of falling asleep) Behavior During Therapy: WFL for tasks assessed/performed Overall Cognitive Status: Impaired/Different from baseline Area of Impairment: Problem solving, Memory                     Memory: Decreased short-term memory       Problem Solving: Slow processing          General Comments General comments (skin integrity, edema, etc.): VSS on RA    Exercises     Assessment/Plan    PT Assessment Patient needs continued PT services  PT Problem List Decreased balance       PT Treatment Interventions Gait training;Balance training;Neuromuscular re-education    PT Goals (Current goals can be found in the Care Plan section)  Acute Rehab PT Goals Patient Stated Goal: to find a job and get back to work PT Goal Formulation: With patient Time For Goal Achievement: 07/29/21 Potential to Achieve Goals: Good Additional Goals Additional Goal #1: Pt  will score >19/24 on the DGI to indicate a reduced risk for falls Additional Goal #2: Pt will score >45/56 on the BERG to indicate a reduced risk for falls.    Frequency Min 3X/week     Co-evaluation               AM-PAC PT "6 Clicks" Mobility  Outcome Measure Help needed turning from your back to your side while in a flat bed without using bedrails?: None Help needed moving from lying on your back to sitting on the side of a flat bed without using bedrails?: None Help needed moving to and from a bed to a chair (including a wheelchair)?: A Little Help needed standing up from a chair using your arms (e.g., wheelchair or bedside chair)?: A Little Help needed to walk in hospital room?: A Little Help needed climbing 3-5 steps with a railing? : A Little 6 Click Score: 20    End of Session   Activity Tolerance: Patient tolerated treatment well Patient left: in chair;with call bell/phone within reach;with family/visitor present;with nursing/sitter in room Nurse Communication: Mobility status PT Visit Diagnosis: Other abnormalities of gait and mobility (R26.89)    Time: 2500-3704 PT Time Calculation (min) (ACUTE ONLY): 17 min   Charges:   PT Evaluation $PT Eval Low Complexity: 1 Low          Arlyss Gandy, PT, DPT Acute Rehabilitation Office [phone]   Arlyss Gandy 07/15/2021, 9:51 AM

## 2021-07-15 NOTE — TOC CAGE-AID Note (Signed)
Transition of Care [facility]) - CAGE-AID Screening   Patient Details  Name: Andrew Parsons MRN: [id number] Date of Birth: [birth year]  Transition of Care [facility]) CM/SW Contact:    Walt Geathers C Tarpley-Carter, LCSWA Phone Number: 07/15/2021, 12:06 PM   Clinical Narrative: Pt participated in Cage-Aid.  Pt stated he does use substance and ETOH.  Pt was offered resources, due to usage of substance and ETOH.     [name], MSW, LCSW-A Pronouns:  She/Her/Hers [facility] Clinical Social Worker Direct Number:  [phone] [name]  CAGE-AID Screening:    Have You Ever Felt You Ought to Cut Down on Your Drinking or Drug Use?: Yes Have People Annoyed You By [employer] Your Drinking Or Drug Use?: Yes Have You Felt Bad Or Guilty About Your Drinking Or Drug Use?: Yes Have You Ever Had a Drink or Used Drugs First Thing In The Morning to Steady Your Nerves or to Get Rid of a Hangover?: No CAGE-AID Score: 3  Substance Abuse Education Offered: Yes  Substance abuse interventions: Transport planner

## 2021-07-15 NOTE — Assessment & Plan Note (Signed)
Replenished prior to discharge. ?

## 2021-07-26 LAB — POCT I-STAT 7, (LYTES, BLD GAS, ICA,H+H)
Bicarbonate: 22.3 mmol/L (ref 20.0–28.0)
Calcium, Ion: 1.23 mmol/L (ref 1.15–1.40)
HCT: 35 % — ABNORMAL LOW (ref 39.0–52.0)
Hemoglobin: 11.9 g/dL — ABNORMAL LOW (ref 13.0–17.0)
O2 Saturation: 100 %
Patient temperature: 98.9
Potassium: 2.9 mmol/L — ABNORMAL LOW (ref 3.5–5.1)
Sodium: 141 mmol/L (ref 135–145)
TCO2: 23 mmol/L (ref 22–32)
pCO2 arterial: 35.8 mmHg (ref 32–48)
pH, Arterial: 7.403 (ref 7.35–7.45)
pO2, Arterial: 269 mmHg — ABNORMAL HIGH (ref 83–108)

## 2022-12-06 IMAGING — CT CT HEAD W/O CM
4 series · 16 of 47 positions shown, 18 images · non-contrast
Comparison: None Available.

CLINICAL DATA: Altered mental status



[Series 2: head wo · axial · 0.45mm/px · z∈[-173,-53]mm · 7 of 32 slices shown, 9 images]
[im 4/32  brain]
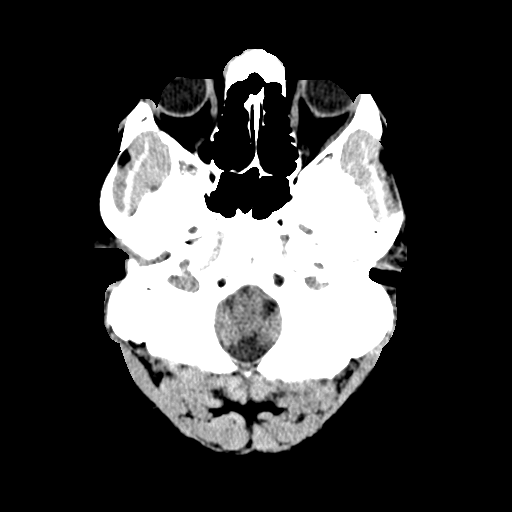
[im 4/32  bone]
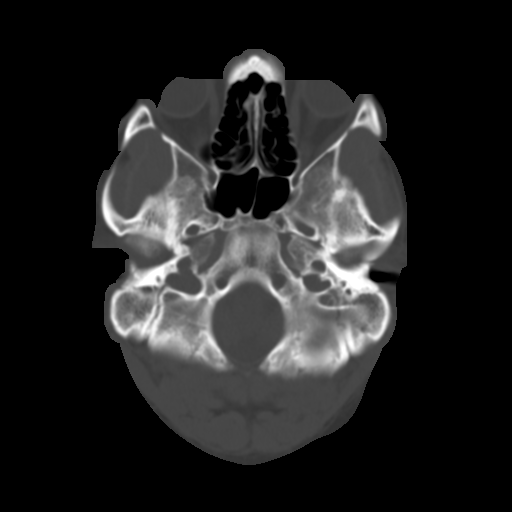
[im 8/32  brain]
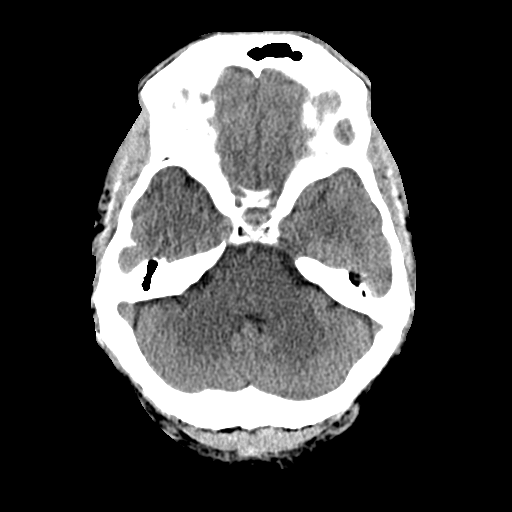
[im 12/32  brain]
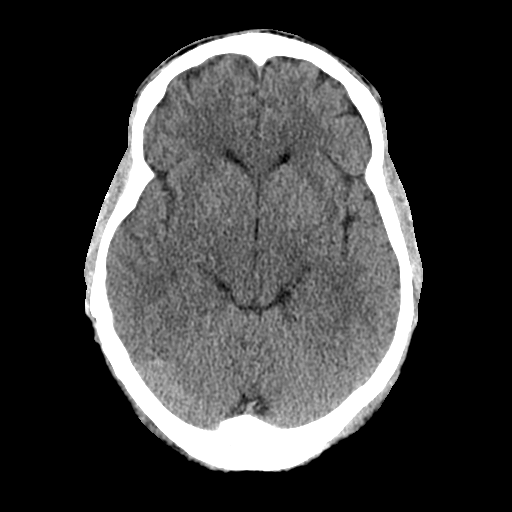
[im 16/32  brain]
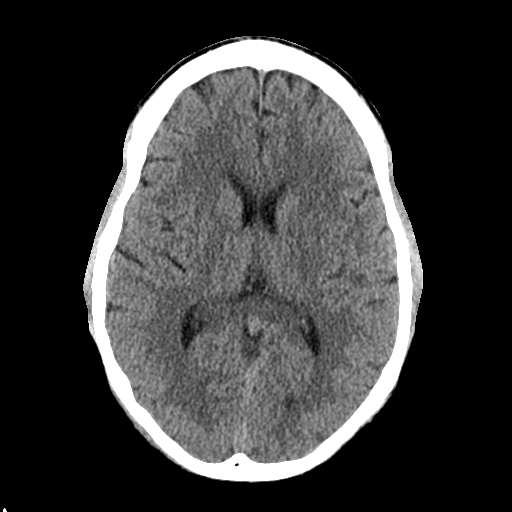
[im 20/32  brain]
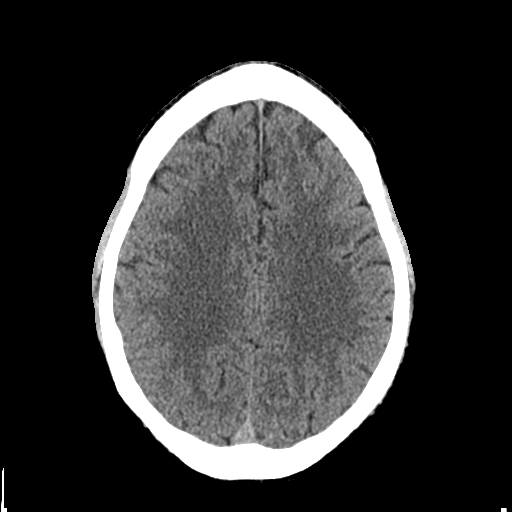
[im 20/32  bone]
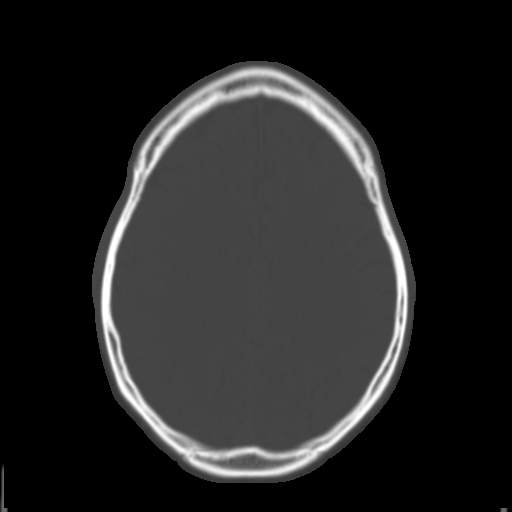
[im 24/32  brain]
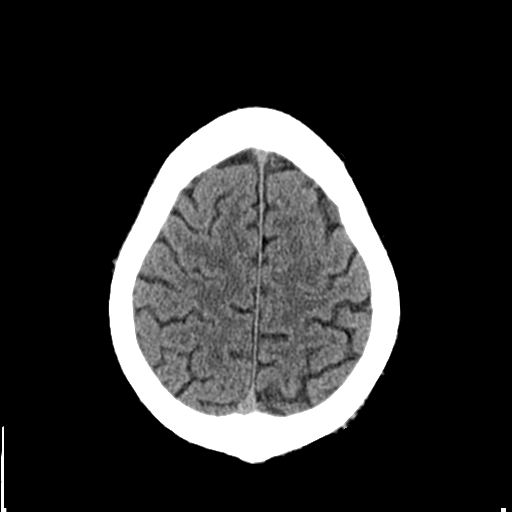
[im 28/32  brain]
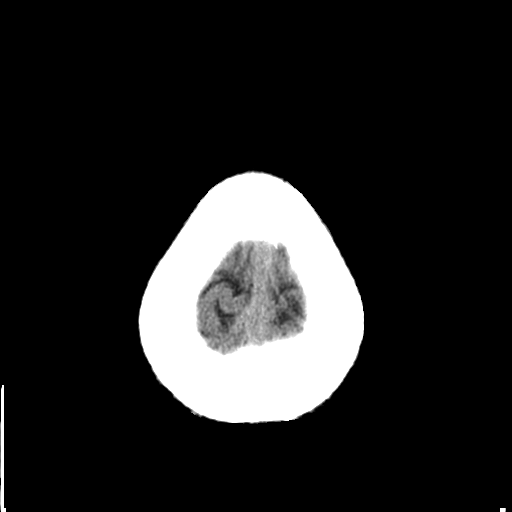

[Series 3: head bone · axial · 0.45mm/px · z∈[-174,-142]mm · 3 of 78 slices shown]
[im 8/78  bone]
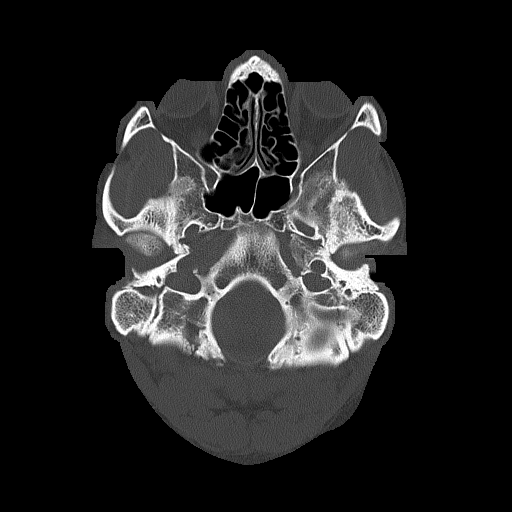
[im 16/78  bone]
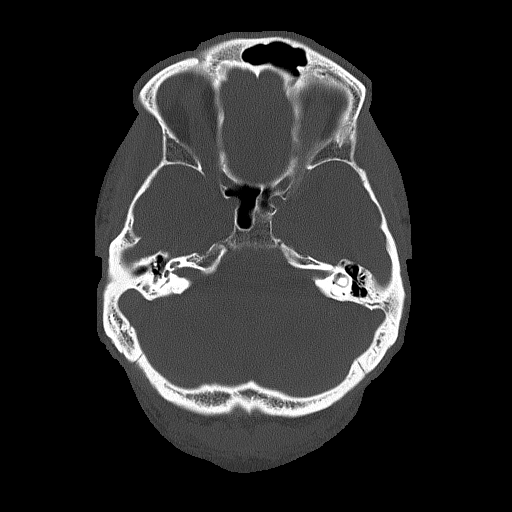
[im 24/78  bone]
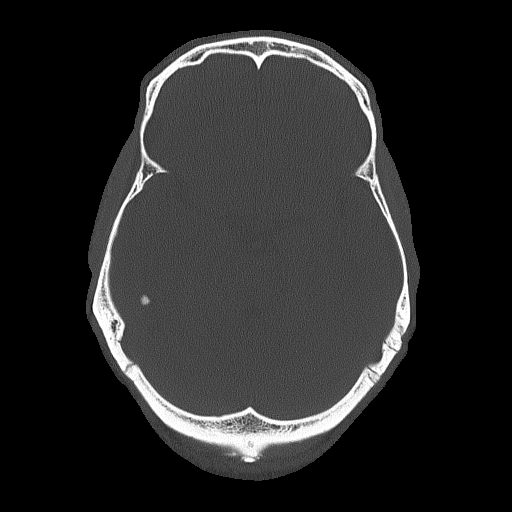

[Series 4: coronal soft tissue · coronal · 0.34mm/px · 3 of 73 slices shown]
[im 25/73  brain]
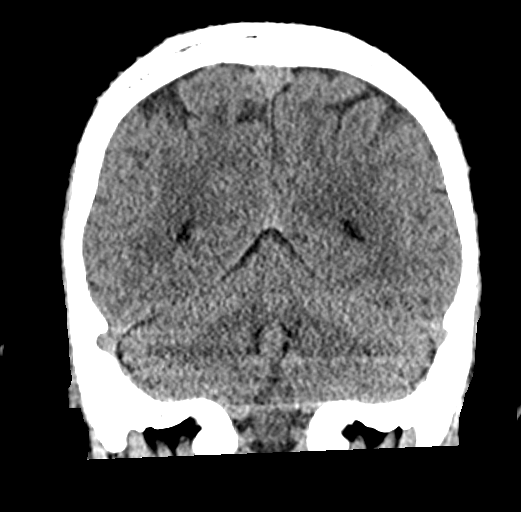
[im 33/73  brain]
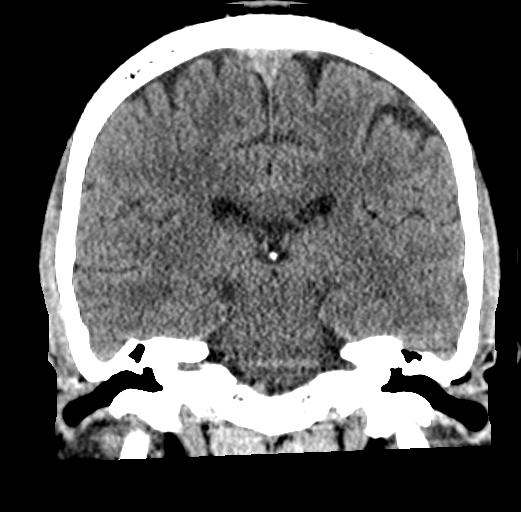
[im 41/73  brain]
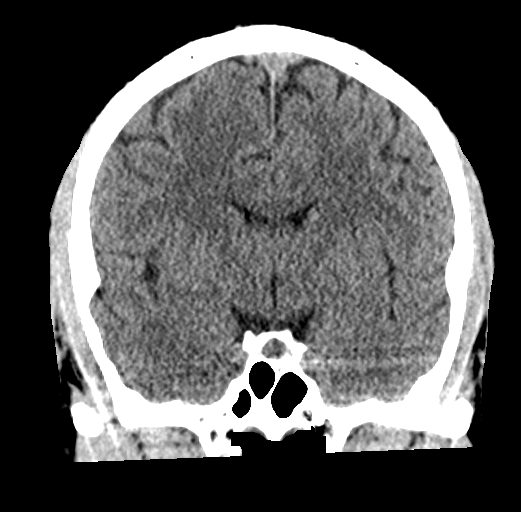

[Series 5: sagittal soft tissue · sagittal · 0.33mm/px · 3 of 60 slices shown]
[im 20/60  brain]
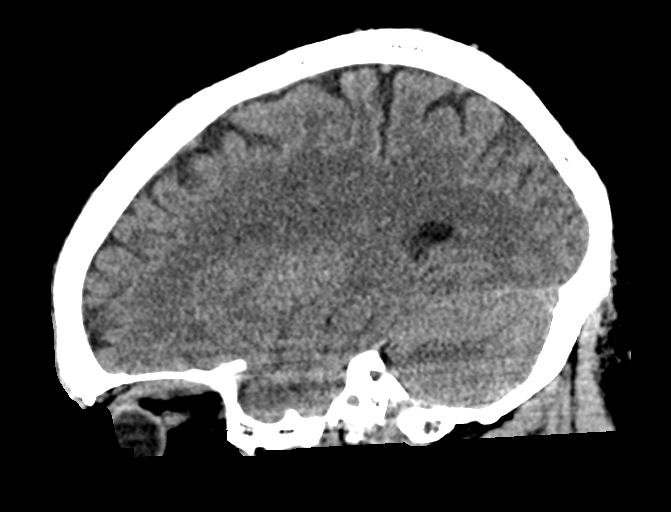
[im 30/60  brain]
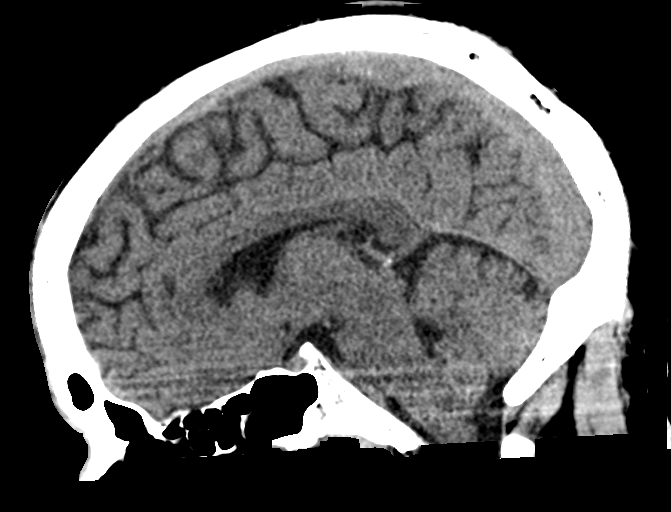
[im 40/60  brain]
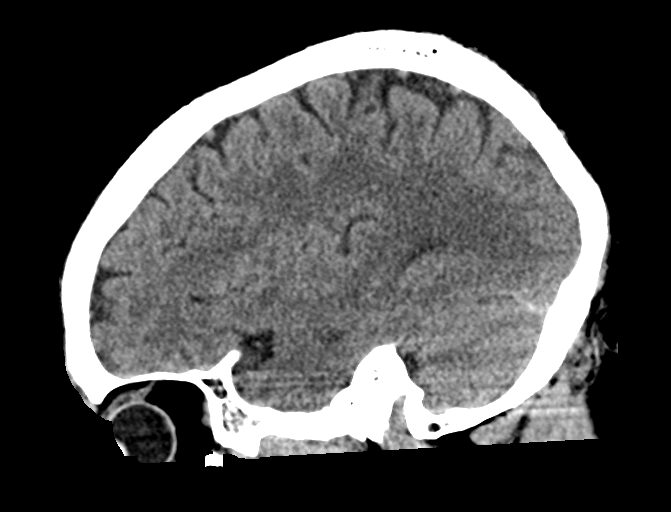

[16 of 47 positions shown; findings below may reference images not displayed]

FINDINGS: Brain: There is no mass, hemorrhage or extra-axial collection. The
size and configuration of the ventricles and extra-axial CSF spaces
are normal. The brain parenchyma is normal, without acute or chronic
infarction.

Vascular: No abnormal hyperdensity of the major intracranial
arteries or dural venous sinuses. No intracranial atherosclerosis.

Skull: The visualized skull base, calvarium and extracranial soft
tissues are normal.

Sinuses/Orbits: No fluid levels or advanced mucosal thickening of
the visualized paranasal sinuses. No mastoid or middle ear effusion.
The orbits are normal.
IMPRESSION: Normal head CT.

## 2022-12-06 IMAGING — CT CT ANGIO CHEST-ABD-PELV FOR DISSECTION W/ AND WO/W CM
2 of 7 series · 13 of 46 positions shown, 15 images · non-contrast
Comparison: None Available.

CLINICAL DATA: Cocaine and methamphetamine use

EXAM:
CT ANGIOGRAPHY CHEST, ABDOMEN AND PELVIS
TECHNIQUE: Non-contrast CT of the chest was initially obtained.

[Series 6: axial arterial · axial · arterial · 0.85mm/px · z∈[-932,-365]mm · 10 of 217 slices shown, 12 images]
[im 14/217  soft-tissue]
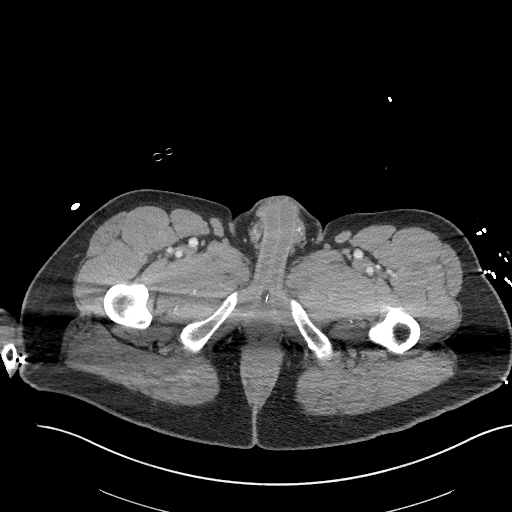
[im 14/217  bone]
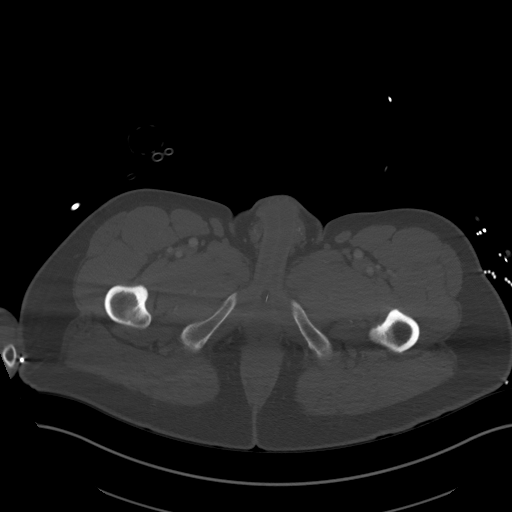
[im 41/217  soft-tissue]
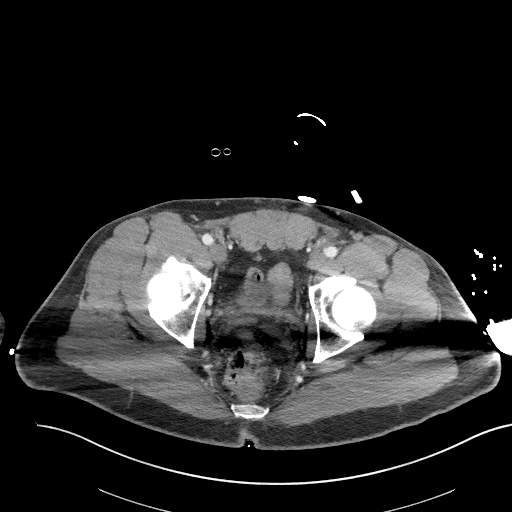
[im 55/217  soft-tissue]
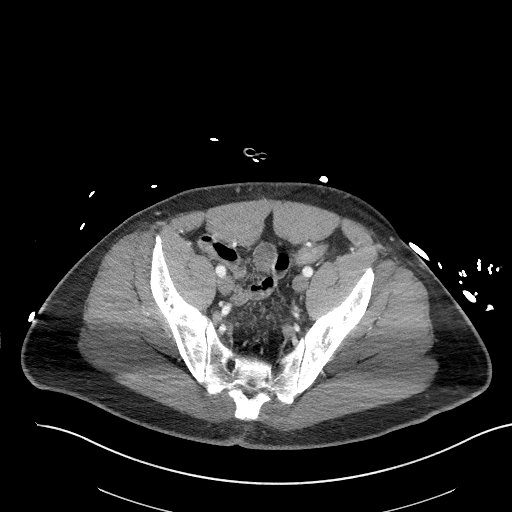
[im 82/217  soft-tissue]
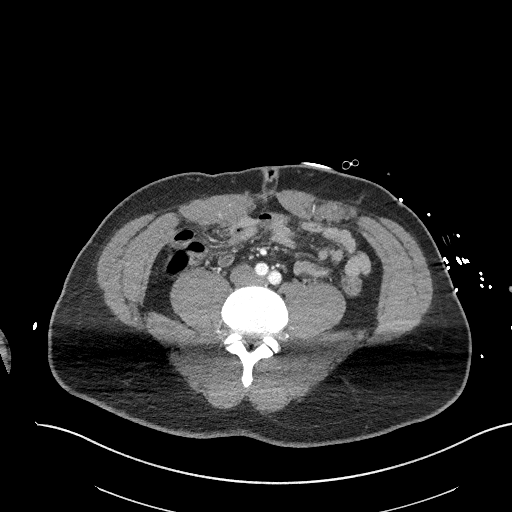
[im 95/217  soft-tissue]
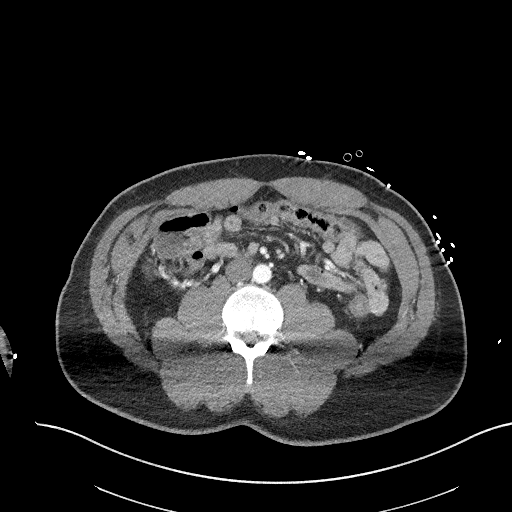
[im 122/217  soft-tissue]
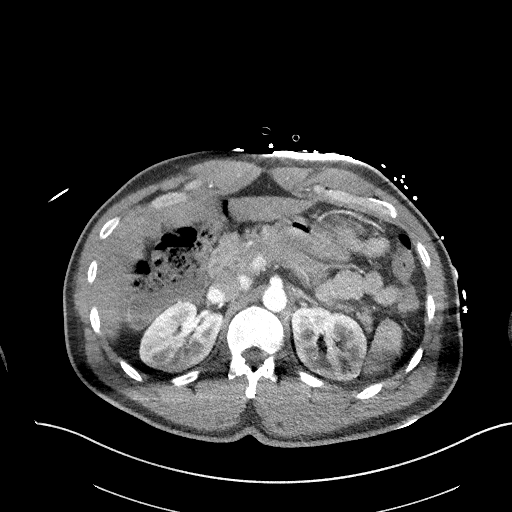
[im 136/217  soft-tissue]
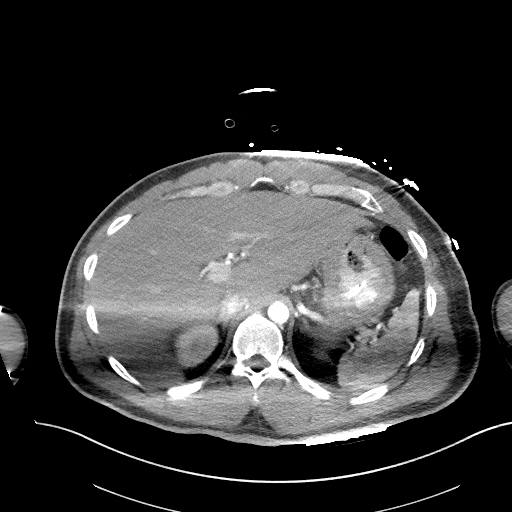
[im 163/217  soft-tissue]
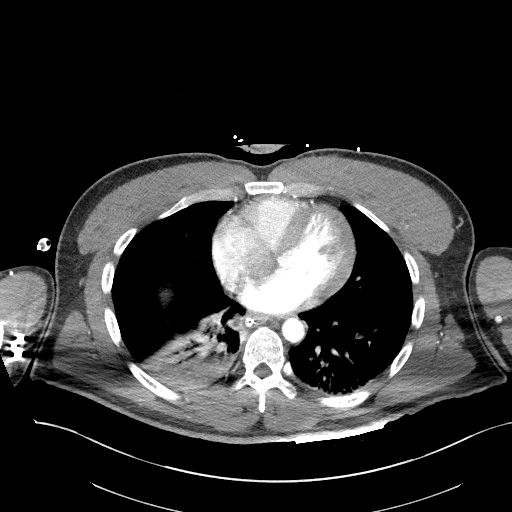
[im 176/217  soft-tissue]
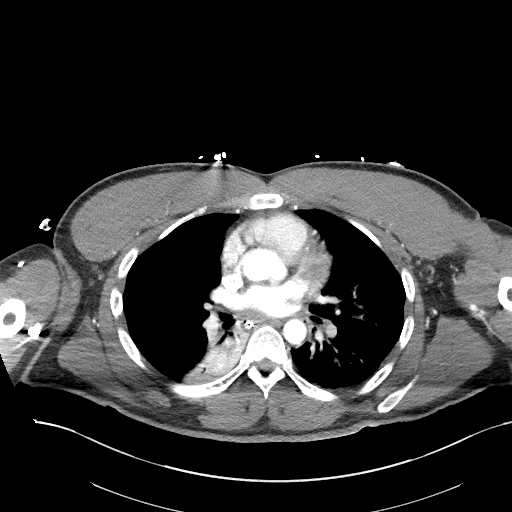
[im 176/217  bone]
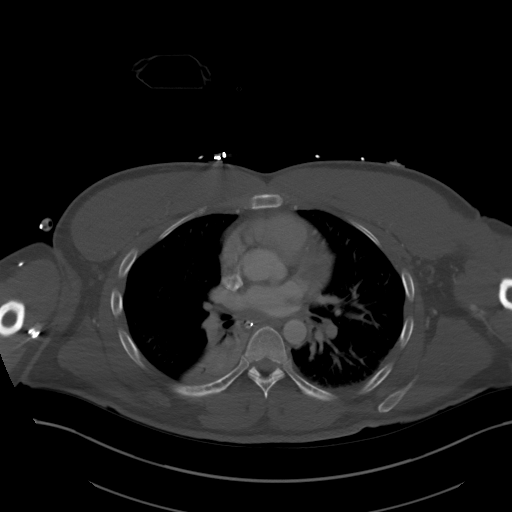
[im 203/217  soft-tissue]
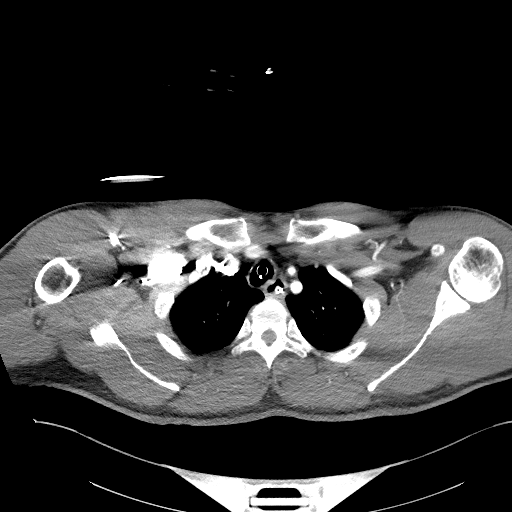

[Series 8: coronals · coronal · 0.76mm/px · 3 of 132 slices shown]
[im 33/132  soft-tissue]
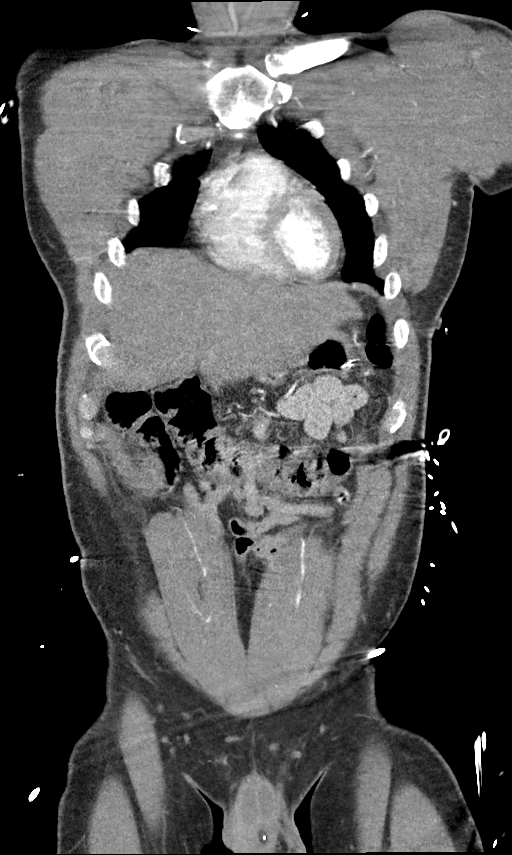
[im 66/132  soft-tissue]
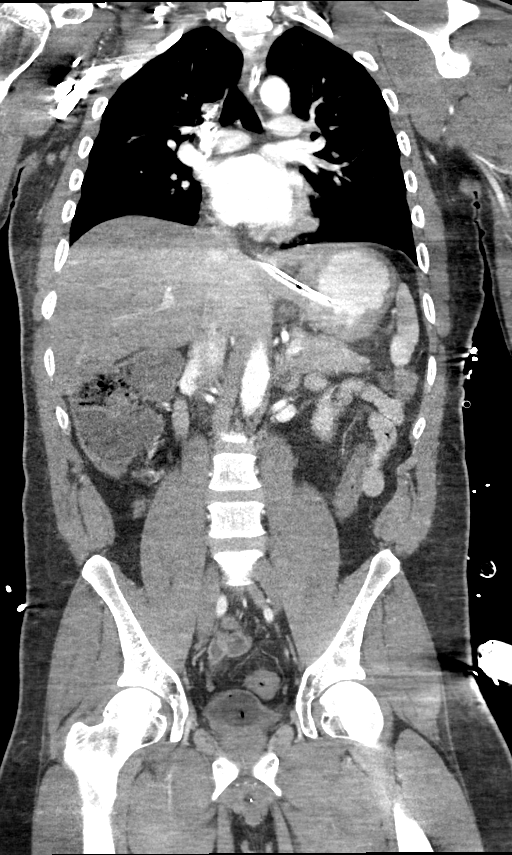
[im 99/132  soft-tissue]
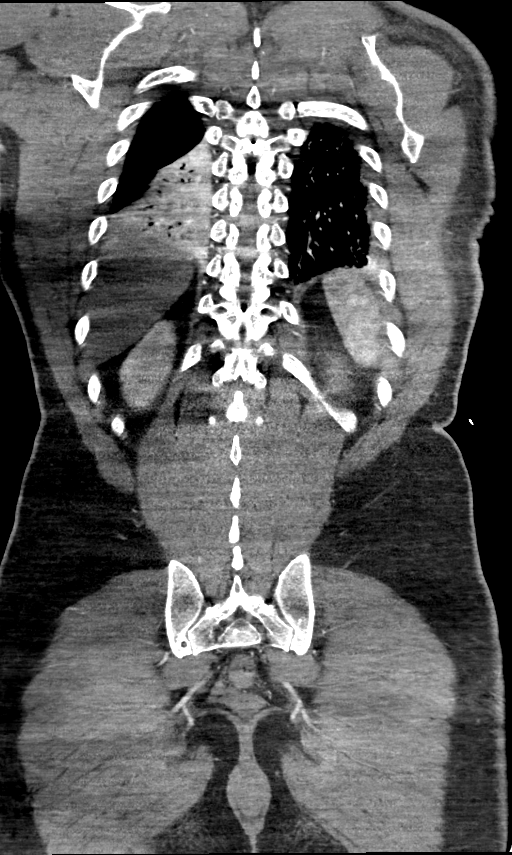

[13 of 46 positions shown; findings below may reference images not displayed]

Multidetector CT imaging through the chest, abdomen and pelvis was
performed using the standard protocol during bolus administration of
intravenous contrast. Multiplanar reconstructed images and MIPs were
obtained and reviewed to evaluate the vascular anatomy.

RADIATION DOSE REDUCTION: This exam was performed according to the
departmental dose-optimization program which includes automated
exposure control, adjustment of the mA and/or kV according to
patient size and/or use of iterative reconstruction technique.

CONTRAST:  100mL OMNIPAQUE IOHEXOL 350 MG/ML SOLN
FINDINGS: CTA CHEST FINDINGS

Cardiovascular:

--Heart: The heart size is normal.  There is nopericardial effusion.

--Aorta: The course and caliber of the thoracic aorta are normal.
There is no aortic atherosclerotic calcification. Precontrast images
show no aortic intramural hematoma. There is no blood pool,
dissection or penetrating ulcer demonstrated on arterial phase
postcontrast imaging. There is a conventional 3 vessel aortic arch
branching pattern. The proximal arch vessels are widely patent.

--Pulmonary Arteries: Contrast timing is optimized for preferential
opacification of the aorta. Within that limitation, normal central
pulmonary arteries.

Mediastinum/Nodes: No mediastinal, hilar or axillary
lymphadenopathy. The visualized thyroid and thoracic esophageal
course are unremarkable.

Lungs/Pleura: Endotracheal tube tip is at the level of the
clavicular heads. There is right lower lobe collapse.

Musculoskeletal: No chest wall abnormality. No acute osseous
findings.

Review of the MIP images confirms the above findings.

CTA ABDOMEN AND PELVIS FINDINGS

VASCULAR

Aorta: Normal caliber aorta without aneurysm, dissection, vasculitis
or hemodynamically significant stenosis. There is no aortic
atherosclerosis.

Celiac: No aneurysm, dissection or hemodynamically significant
stenosis. Normal branching pattern.

SMA: Widely patent without dissection or stenosis.

Renals: Single renal arteries bilaterally. No aneurysm, dissection,
stenosis or evidence of fibromuscular dysplasia.

IMA: Patent without abnormality.

Inflow: No aneurysm, stenosis or dissection.

Veins: Normal course and caliber of the major veins. Assessment is
otherwise limited by the arterial dominant contrast phase.

Review of the MIP images confirms the above findings.

NON-VASCULAR

Hepatobiliary: Normal hepatic contours and density. No visible
biliary dilatation. Normal gallbladder.

Pancreas: Normal contours without ductal dilatation. No
peripancreatic fluid collection.

Spleen: Normal arterial phase splenic enhancement pattern.

Adrenals/Urinary Tract:

--Adrenal glands: Normal.

--Right kidney/ureter: No hydronephrosis or perinephric stranding.
No nephrolithiasis. No obstructing ureteral stones.

--Left kidney/ureter: No hydronephrosis or perinephric stranding. No
nephrolithiasis. No obstructing ureteral stones.

--Urinary bladder: Unremarkable.

Stomach/Bowel:

--Stomach/Duodenum: No hiatal hernia or other gastric abnormality.
Normal duodenal course and caliber.

--Small bowel: No dilatation or inflammation.

--Colon: No focal abnormality.

--Appendix: Normal.

Lymphatic:  No abdominal or pelvic lymphadenopathy.

Reproductive: Normal prostate and seminal vesicles.

Musculoskeletal. No bony spinal canal stenosis or focal osseous
abnormality.

Other: Incidental right abdominal wall intramuscular lipoma.

Review of the MIP images confirms the above findings.
IMPRESSION: 1. No acute aortic syndrome.
2. Right lower lobe collapse.
3. No acute abnormality of the abdomen pelvis.

## 2022-12-07 IMAGING — DX DG CHEST 1V PORT
1 series · 1 of 1 positions shown · non-contrast
Comparison: 07/13/2021

CLINICAL DATA: Follow-up ventilator support

EXAM:
PORTABLE CHEST 1 VIEW

[chest]
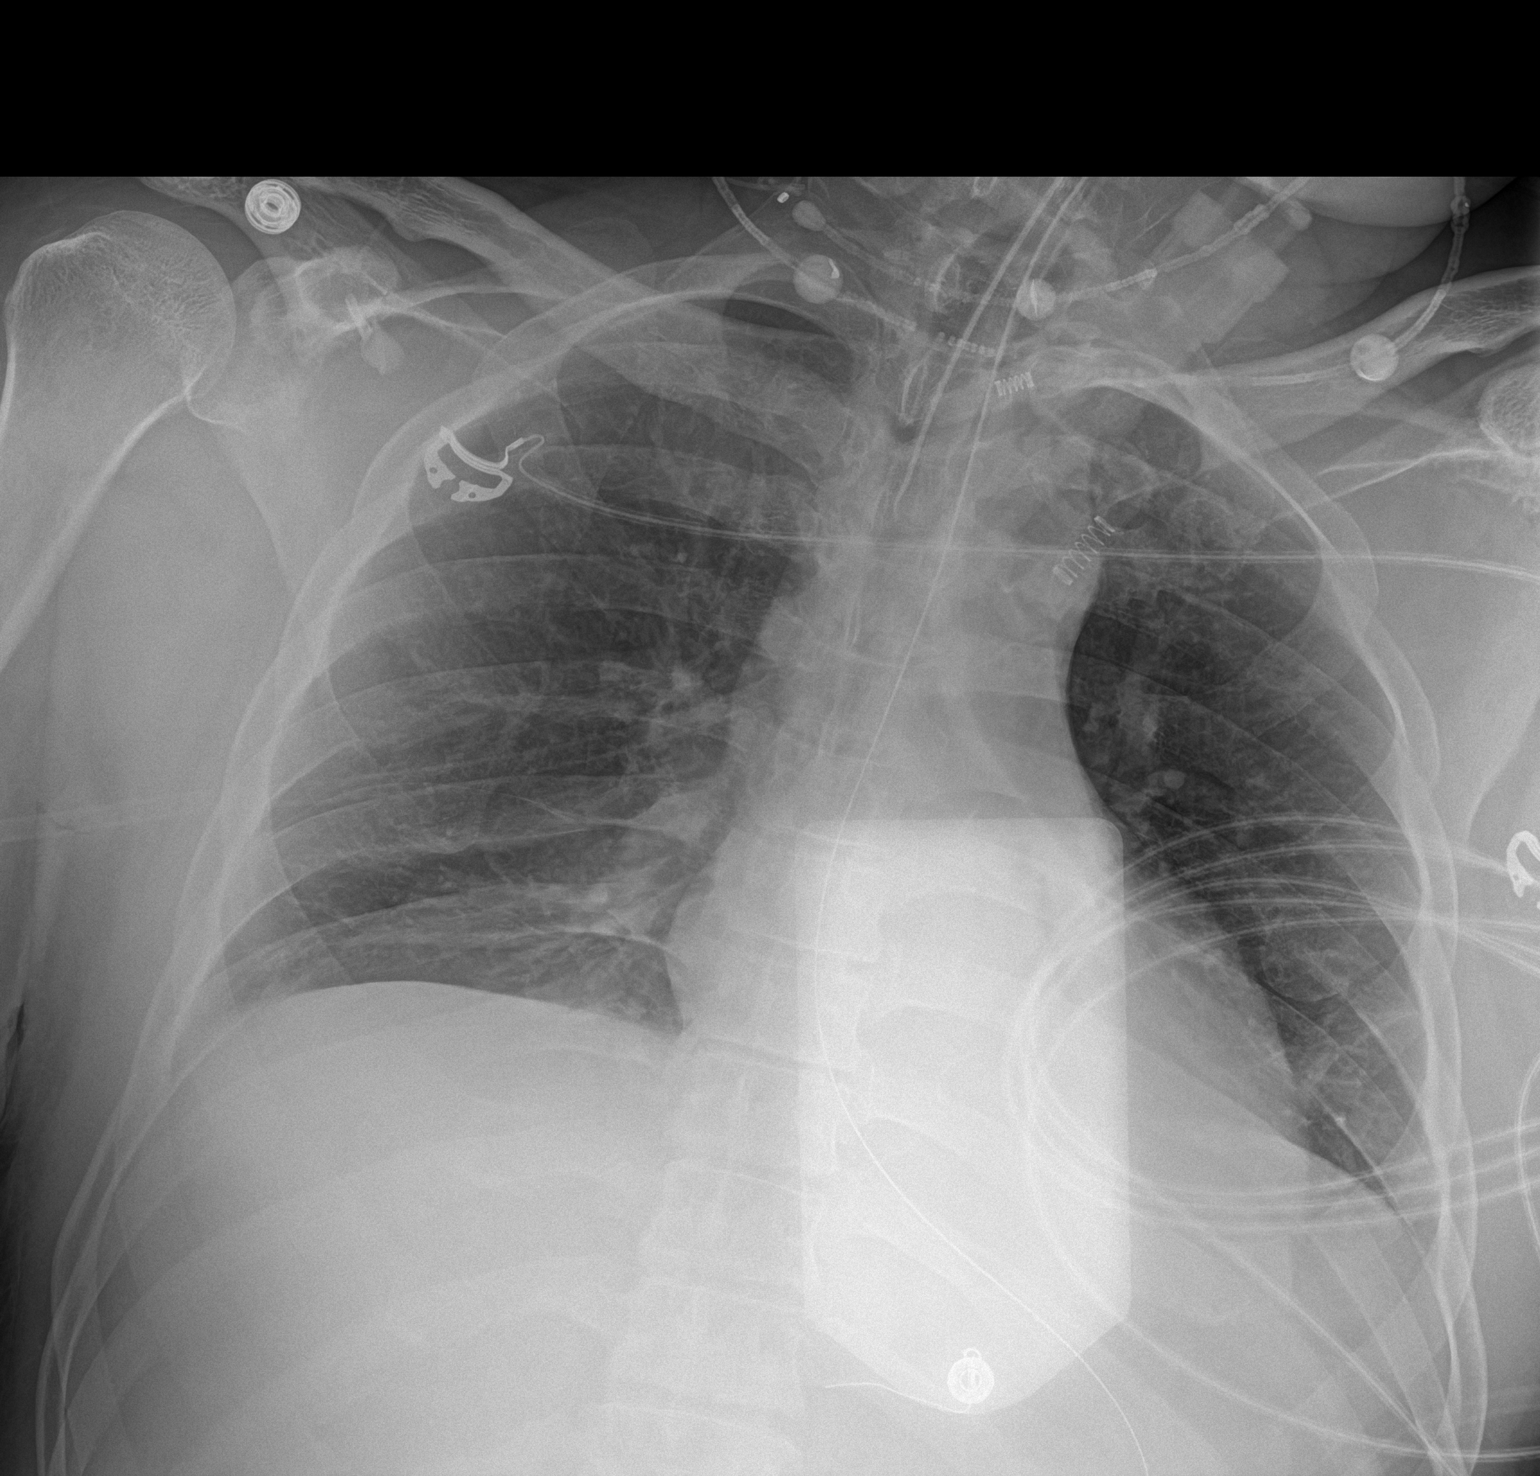

[1 of 1 positions shown; findings below may reference images not displayed]

FINDINGS: Endotracheal tube tip 4 cm above the carina. Nasogastric or
orogastric tube enters the abdomen. Worsened volume loss in both
lower lobes compared to yesterday's films. Upper lungs remain clear.
IMPRESSION: Worsened volume loss in both lower lobes.
